# Patient Record
Sex: Female | Born: 1950 | Race: White | Hispanic: No | Marital: Single | Smoking: Never smoker
Health system: Southern US, Community
[De-identification: ages and names within clinical notes are randomized; demographics above are authoritative.]

## PROBLEM LIST (undated history)

## (undated) DIAGNOSIS — J189 Pneumonia, unspecified organism: Secondary | ICD-10-CM

## (undated) DIAGNOSIS — M1990 Unspecified osteoarthritis, unspecified site: Secondary | ICD-10-CM

## (undated) DIAGNOSIS — E039 Hypothyroidism, unspecified: Secondary | ICD-10-CM

## (undated) HISTORY — PX: EYE SURGERY: SHX253

## (undated) HISTORY — PX: TONSILLECTOMY: SUR1361

---

## 2018-06-03 ENCOUNTER — Ambulatory Visit (INDEPENDENT_AMBULATORY_CARE_PROVIDER_SITE_OTHER): Payer: Medicare Other | Admitting: Family Medicine

## 2018-06-03 ENCOUNTER — Ambulatory Visit (INDEPENDENT_AMBULATORY_CARE_PROVIDER_SITE_OTHER): Payer: Medicare Other

## 2018-06-03 ENCOUNTER — Encounter (INDEPENDENT_AMBULATORY_CARE_PROVIDER_SITE_OTHER): Payer: Self-pay | Admitting: Family Medicine

## 2018-06-03 DIAGNOSIS — M25552 Pain in left hip: Secondary | ICD-10-CM | POA: Diagnosis not present

## 2018-06-03 DIAGNOSIS — M25551 Pain in right hip: Secondary | ICD-10-CM

## 2018-06-03 NOTE — Progress Notes (Signed)
Office Visit Note   Patient: Suzanne CollegeDeborah Gibson           Date of Birth: 06/17/1951           MRN: [id number] Visit Date: 06/03/2018 Requested by: No referring provider defined for this encounter. PCP: No primary care provider on file.  Subjective: Chief Complaint  Patient presents with  . Left Hip - Pain    Pain x 2 years.  Tried PT - temporary relief.  Worsening pain.    HPI: She is a 67 year old with left greater than right hip pain.  She is seen at the request of Suzanne Gibson.  Symptoms started in 2014.  Around that time [place] and was bit by a wild dog.  The bite was in her lower left leg.  She was treated with rabies vaccine and antibiotics and eventually the wound healed but a few months later she started having a lot of problems with her left hip.  Her right hip was hurting as well.  She has continued to have troubles since then with groin pain radiating down the medial thigh and across to the lateral knee as well as in the posterior hip.  She has similar pain on the right side but not as severe.  She has been managing with holistic treatments and massage and physical therapy.  She had an MRI scan 2 years ago which she brought with her on CD showing moderate arthritis and subchondral cystic change in the femoral head.  The radiology report is not available.  On a bad day her pain is severe and she can hardly get out of the chair.  Most days she has stiffness and discomfort and a sensation of weakness.  She is living in [place] but is not confident in the treatment she has available and frequently comes here to visit her niece who is with her today.  She wanted to get our opinion.              ROS: She has thyroid dysfunction.  All other systems are negative.  Objective: Vital Signs: There were no vitals taken for this visit.  Physical Exam:  Hips: Slight leg length discrepancy with right leg a little bit longer than the left.  Both hips have limited range of  motion with passive flexion and internal rotation, and this causes a moderate amount of pain.  External rotation motion is better bilaterally.  No significant pain over the greater trochanter.  Mild pain with isometric hip flexion, adduction, abduction.  She is able to walk without a limp.    Imaging: X-rays pelvis and bilateral hips: L4-5DD; moderate-severe left hip DJD with joint space narrowing and periarticular spurring, as well as subchondral cystic change.  Mild-moderate right hip DJD.   Assessment & Plan: 1.  Left greater than right hip pain, most likely due to DJD. - MRI per patient request. - She has a consult this Wednesday to discuss surgical options. - If surgery delayed, could consider a diagnostic/therapeutic injection.   Follow-Up Instructions: No follow-ups on file.      Procedures: No procedures performed  No notes on file    PMFS History: There are no active problems to display for this patient.  History reviewed. No pertinent past medical history.  History reviewed. No pertinent family history.  History reviewed. No pertinent surgical history. Social History   Occupational History  . Not on file  Tobacco Use  . Smoking status: Never Smoker  .  Smokeless tobacco: Never Used  Substance and Sexual Activity  . Alcohol use: Yes    Alcohol/week: 2.0 standard drinks    Types: 2 Glasses of wine per week  . Drug use: Never  . Sexual activity: Not on file

## 2018-06-11 ENCOUNTER — Other Ambulatory Visit: Payer: Self-pay

## 2018-08-19 NOTE — Progress Notes (Signed)
07-29-18 Surgical clearance from Dr. Janine Ores on chart

## 2018-08-19 NOTE — Patient Instructions (Signed)
Suzanne Gibson  08/19/2018   Your procedure is scheduled on: 08-27-18   Report to [facility] Main  Entrance    Report to Admitting at 6:10 AM    Call this number if you have problems the morning of surgery [phone]    Remember: Do not eat food or drink liquids :After Midnight.    BRUSH YOUR TEETH MORNING OF SURGERY AND RINSE YOUR MOUTH OUT, NO CHEWING GUM CANDY OR MINTS.     Take these medicines the morning of surgery with A SIP OF WATER: None                                 You may not have any metal on your body including hair pins and              piercings  Do not wear jewelry, make-up, lotions, powders or perfumes, deodorant             Do not wear nail polish.  Do not shave  48 hours prior to surgery.               Do not bring valuables to the hospital.  IS NOT             RESPONSIBLE   FOR VALUABLES.  Contacts, dentures or bridgework may not be worn into surgery.  Leave suitcase in the car. After surgery it may be brought to your room.     Patients discharged the day of surgery will not be allowed to drive home. IF YOU ARE HAVING SURGERY AND GOING HOME THE SAME DAY, YOU MUST HAVE AN ADULT TO DRIVE YOU HOME AND BE WITH YOU FOR 24 HOURS. YOU MAY GO HOME BY TAXI OR UBER OR ORTHERWISE, BUT AN ADULT MUST ACCOMPANY YOU HOME AND STAY WITH YOU FOR 24 HOURS.  Name and phone number of your driver:  Special Instructions: N/A              Please read over the following fact sheets you were given: _____________________________________________________________________             [facility] - Preparing for Surgery Before surgery, you can play an important role.  Because skin is not sterile, your skin needs to be as free of germs as possible.  You can reduce the number of germs on your skin by washing with CHG (chlorahexidine gluconate) soap before surgery.  CHG is an antiseptic cleaner which kills germs and bonds with the skin to continue  killing germs even after washing. Please DO NOT use if you have an allergy to CHG or antibacterial soaps.  If your skin becomes reddened/irritated stop using the CHG and inform your nurse when you arrive at Short Stay. Do not shave (including legs and underarms) for at least 48 hours prior to the first CHG shower.  You may shave your face/neck. Please follow these instructions carefully:  1.  Shower with CHG Soap the night before surgery and the  morning of Surgery.  2.  If you choose to wash your hair, wash your hair first as usual with your  normal  shampoo.  3.  After you shampoo, rinse your hair and body thoroughly to remove the  shampoo.  4.  Use CHG as you would any other liquid soap.  You can apply chg directly  to the skin and wash                       Gently with a scrungie or clean washcloth.  5.  Apply the CHG Soap to your body ONLY FROM THE NECK DOWN.   Do not use on face/ open                           Wound or open sores. Avoid contact with eyes, ears mouth and genitals (private parts).                       Wash face,  Genitals (private parts) with your normal soap.             6.  Wash thoroughly, paying special attention to the area where your surgery  will be performed.  7.  Thoroughly rinse your body with warm water from the neck down.  8.  DO NOT shower/wash with your normal soap after using and rinsing off  the CHG Soap.                9.  Pat yourself dry with a clean towel.            10.  Wear clean pajamas.            11.  Place clean sheets on your bed the night of your first shower and do not  sleep with pets. Day of Surgery : Do not apply any lotions/deodorants the morning of surgery.  Please wear clean clothes to the hospital/surgery center.  FAILURE TO FOLLOW THESE INSTRUCTIONS MAY RESULT IN THE CANCELLATION OF YOUR SURGERY PATIENT SIGNATURE_________________________________  NURSE  SIGNATURE__________________________________  ________________________________________________________________________   Suzanne Gibson  An incentive spirometer is a tool that can help keep your lungs clear and active. This tool measures how well you are filling your lungs with each breath. Taking long deep breaths may help reverse or decrease the chance of developing breathing (pulmonary) problems (especially infection) following:  A long period of time when you are unable to move or be active. BEFORE THE PROCEDURE   If the spirometer includes an indicator to show your best effort, your nurse or respiratory therapist will set it to a desired goal.  If possible, sit up straight or lean slightly forward. Try not to slouch.  Hold the incentive spirometer in an upright position. INSTRUCTIONS FOR USE  1. Sit on the edge of your bed if possible, or sit up as far as you can in bed or on a chair. 2. Hold the incentive spirometer in an upright position. 3. Breathe out normally. 4. Place the mouthpiece in your mouth and seal your lips tightly around it. 5. Breathe in slowly and as deeply as possible, raising the piston or the ball toward the top of the column. 6. Hold your breath for 3-5 seconds or for as long as possible. Allow the piston or ball to fall to the bottom of the column. 7. Remove the mouthpiece from your mouth and breathe out normally. 8. Rest for a few seconds and repeat Steps 1 through 7 at least 10 times every 1-2 hours when you are awake. Take your time and take a few normal breaths between deep breaths. 9. The spirometer may include an indicator to show  your best effort. Use the indicator as a goal to work toward during each repetition. 10. After each set of 10 deep breaths, practice coughing to be sure your lungs are clear. If you have an incision (the cut made at the time of surgery), support your incision when coughing by placing a pillow or rolled up towels firmly  against it. Once you are able to get out of bed, walk around indoors and cough well. You may stop using the incentive spirometer when instructed by your caregiver.  RISKS AND COMPLICATIONS  Take your time so you do not get dizzy or light-headed.  If you are in pain, you may need to take or ask for pain medication before doing incentive spirometry. It is harder to take a deep breath if you are having pain. AFTER USE  Rest and breathe slowly and easily.  It can be helpful to keep track of a log of your progress. Your caregiver can provide you with a simple table to help with this. If you are using the spirometer at home, follow these instructions: [place] IF:   You are having difficultly using the spirometer.  You have trouble using the spirometer as often as instructed.  Your pain medication is not giving enough relief while using the spirometer.  You develop fever of 100.5 F (38.1 C) or higher. SEEK IMMEDIATE MEDICAL CARE IF:   You cough up bloody sputum that had not been present before.  You develop fever of 102 F (38.9 C) or greater.  You develop worsening pain at or near the incision site. MAKE SURE YOU:   Understand these instructions.  Will watch your condition.  Will get help right away if you are not doing well or get worse. Document Released: 11/13/2006 Document Revised: 09/25/2011 Document Reviewed: 01/14/2007 ExitCare Patient Information 2014 ExitCare, [place].   ________________________________________________________________________  WHAT IS A BLOOD TRANSFUSION? Blood Transfusion Information  A transfusion is the replacement of blood or some of its parts. Blood is made up of multiple cells which provide different functions.  Red blood cells carry oxygen and are used for blood loss replacement.  White blood cells fight against infection.  Platelets control bleeding.  Plasma helps clot blood.  Other blood products are available for  specialized needs, such as hemophilia or other clotting disorders. BEFORE THE TRANSFUSION  Who gives blood for transfusions?   Healthy volunteers who are fully evaluated to make sure their blood is safe. This is blood bank blood. Transfusion therapy is the safest it has ever been in the practice of medicine. Before blood is taken from a donor, a complete history is taken to make sure that person has no history of diseases nor engages in risky social behavior (examples are intravenous drug use or sexual activity with multiple partners). The donor's travel history is screened to minimize risk of transmitting infections, such as malaria. The donated blood is tested for signs of infectious diseases, such as HIV and hepatitis. The blood is then tested to be sure it is compatible with you in order to minimize the chance of a transfusion reaction. If you or a relative donates blood, this is often done in anticipation of surgery and is not appropriate for emergency situations. It takes many days to process the donated blood. RISKS AND COMPLICATIONS Although transfusion therapy is very safe and saves many lives, the main dangers of transfusion include:   Getting an infectious disease.  Developing a transfusion reaction. This is an allergic reaction to  something in the blood you were given. Every precaution is taken to prevent this. The decision to have a blood transfusion has been considered carefully by your caregiver before blood is given. Blood is not given unless the benefits outweigh the risks. AFTER THE TRANSFUSION  Right after receiving a blood transfusion, you will usually feel much better and more energetic. This is especially true if your red blood cells have gotten low (anemic). The transfusion raises the level of the red blood cells which carry oxygen, and this usually causes an energy increase.  The nurse administering the transfusion will monitor you carefully for complications. HOME CARE  INSTRUCTIONS  No special instructions are needed after a transfusion. You may find your energy is better. Speak with your caregiver about any limitations on activity for underlying diseases you may have. SEEK MEDICAL CARE IF:   Your condition is not improving after your transfusion.  You develop redness or irritation at the intravenous (IV) site. SEEK IMMEDIATE MEDICAL CARE IF:  Any of the following symptoms occur over the next 12 hours:  Shaking chills.  You have a temperature by mouth above 102 F (38.9 C), not controlled by medicine.  Chest, back, or muscle pain.  People around you feel you are not acting correctly or are confused.  Shortness of breath or difficulty breathing.  Dizziness and fainting.  You get a rash or develop hives.  You have a decrease in urine output.  Your urine turns a dark color or changes to pink, red, or brown. Any of the following symptoms occur over the next 10 days:  You have a temperature by mouth above 102 F (38.9 C), not controlled by medicine.  Shortness of breath.  Weakness after normal activity.  The white part of the eye turns yellow (jaundice).  You have a decrease in the amount of urine or are urinating less often.  Your urine turns a dark color or changes to pink, red, or brown. Document Released: 06/30/2000 Document Revised: 09/25/2011 Document Reviewed: 02/17/2008 [facility] Patient Information 2014 [place], [place].  _______________________________________________________________________

## 2018-08-21 ENCOUNTER — Other Ambulatory Visit: Payer: Self-pay

## 2018-08-21 ENCOUNTER — Encounter (HOSPITAL_COMMUNITY): Payer: Self-pay | Admitting: *Deleted

## 2018-08-21 ENCOUNTER — Encounter (HOSPITAL_COMMUNITY)
Admission: RE | Admit: 2018-08-21 | Discharge: 2018-08-21 | Disposition: A | Discharge status: Home / Self Care | Payer: Medicare Other | Source: Ambulatory Visit | Attending: Orthopedic Surgery | Admitting: Orthopedic Surgery

## 2018-08-21 DIAGNOSIS — M1612 Unilateral primary osteoarthritis, left hip: Secondary | ICD-10-CM | POA: Insufficient documentation

## 2018-08-21 DIAGNOSIS — Z01812 Encounter for preprocedural laboratory examination: Secondary | ICD-10-CM | POA: Diagnosis not present

## 2018-08-21 HISTORY — DX: Hypothyroidism, unspecified: E03.9

## 2018-08-21 HISTORY — DX: Pneumonia, unspecified organism: J18.9

## 2018-08-21 LAB — BASIC METABOLIC PANEL
Anion gap: 6 (ref 5–15)
BUN: 15 mg/dL (ref 8–23)
CO2: 29 mmol/L (ref 22–32)
Calcium: 8.8 mg/dL — ABNORMAL LOW (ref 8.9–10.3)
Chloride: 103 mmol/L (ref 98–111)
Creatinine, Ser: 1.05 mg/dL — ABNORMAL HIGH (ref 0.44–1.00)
GFR calc Af Amer: >60 mL/min (ref >60)
GFR calc non Af Amer: 54 mL/min — ABNORMAL LOW (ref >60)
Glucose, Bld: 102 mg/dL — ABNORMAL HIGH (ref 70–99)
Potassium: 3.9 mmol/L (ref 3.5–5.1)
Sodium: 138 mmol/L (ref 135–145)

## 2018-08-21 LAB — CBC
HEMATOCRIT: 40.3 % (ref 36.0–46.0)
Hemoglobin: 13.4 g/dL (ref 12.0–15.0)
MCH: 32.4 pg (ref 26.0–34.0)
MCHC: 33.3 g/dL (ref 30.0–36.0)
MCV: 97.3 fL (ref 80.0–100.0)
Platelets: 274 10*3/uL (ref 150–400)
RBC: 4.14 MIL/uL (ref 3.87–5.11)
RDW: 13.4 % (ref 11.5–15.5)
WBC: 6.9 10*3/uL (ref 4.0–10.5)
nRBC: 0 % (ref 0.0–0.2)

## 2018-08-21 LAB — SURGICAL PCR SCREEN
MRSA, PCR: NEGATIVE
STAPHYLOCOCCUS AUREUS: NEGATIVE

## 2018-08-22 LAB — ABO/RH: ABO/RH(D): O POS

## 2018-08-22 NOTE — H&P (Signed)
TOTAL HIP ADMISSION H&P  Patient is admitted for left total hip arthroplasty, anterior approach.  Subjective:  Chief Complaint:    Left hip primary OA / pain  HPI: Suzanne Gibson, 68 y.o. female, has a history of pain and functional disability in the left hip(s) due to arthritis and patient has failed non-surgical conservative treatments for greater than 12 weeks to include NSAID's and/or analgesics, supervised PT with diminished ADL's post treatment and activity modification.  Onset of symptoms was gradual starting 3-4 years ago with gradually worsening course since that time.The patient noted no past surgery on the left hip(s).  Patient currently rates pain in the left hip at 9 out of 10 with activity. Patient has worsening of pain with activity and weight bearing, trendelenberg gait, pain that interfers with activities of daily living and pain with passive range of motion. Patient has evidence of periarticular osteophytes and joint space narrowing by imaging studies. This condition presents safety issues increasing the risk of falls.  There is no current active infection.   Risks, benefits and expectations were discussed with the patient.  Risks including but not limited to the risk of anesthesia, blood clots, nerve damage, blood vessel damage, failure of the prosthesis, infection and up to and including death.  Patient understand the risks, benefits and expectations and wishes to proceed with surgery.   PCP: System, Pcp Not In  D/C Plans:       Home   Post-op Meds:       No Rx given  Tranexamic Acid:      To be given - IV   Decadron:      Is to be given  FYI:      ASA  APAP scheduled  Tramadol prn  DME:   Rx given for - RW & 3-n-1  PT:   No PT     Past Medical History:  Diagnosis Date  . Hypothyroidism   . Pneumonia     Past Surgical History:  Procedure Laterality Date  . TONSILLECTOMY      No current facility-administered medications for this encounter.    Current  Outpatient Medications  Medication Sig Dispense Refill Last Dose  . b complex vitamins capsule Take 1 capsule by mouth daily.     . Multiple Vitamin (MULTIVITAMIN) tablet Take 1 tablet by mouth daily.     . NONFORMULARY OR COMPOUNDED ITEM Apply 1 application topically See admin instructions. Apply topically 3 times daily six days a week.  Estrogen, Testosterone and Progesterone cream     . Thyroid 48.75 MG TABS Take 48.75 mg by mouth daily. 3/4 grain of armour thyroid      Allergies  Allergen Reactions  . Augmentin [Amoxicillin-Pot Clavulanate] Rash  . Levofloxacin Rash    Headache    Social History   Tobacco Use  . Smoking status: Never Smoker  . Smokeless tobacco: Never Used  Substance Use Topics  . Alcohol use: Yes    Alcohol/week: 2.0 standard drinks    Types: 2 Glasses of wine per week       Review of Systems  Constitutional: Negative.   HENT: Negative.   Eyes: Negative.   Respiratory: Negative.   Cardiovascular: Negative.   Gastrointestinal: Negative.   Genitourinary: Negative.   Musculoskeletal: Positive for joint pain.  Skin: Negative.   Neurological: Negative.   Endo/Heme/Allergies: Negative.   Psychiatric/Behavioral: Negative.     Objective:  Physical Exam  Constitutional: She is oriented to person, place, and time. She  appears well-developed.  HENT:  Head: Normocephalic.  Eyes: Pupils are equal, round, and reactive to light.  Neck: Neck supple. No JVD present. No tracheal deviation present. No thyromegaly present.  Cardiovascular: Normal rate, regular rhythm and intact distal pulses.  Respiratory: Effort normal and breath sounds normal. No respiratory distress. She has no wheezes.  GI: Soft. There is no abdominal tenderness. There is no guarding.  Musculoskeletal:     Left hip: She exhibits decreased range of motion, decreased strength, tenderness and bony tenderness. She exhibits no swelling, no deformity and no laceration.  Lymphadenopathy:    She  has no cervical adenopathy.  Neurological: She is alert and oriented to person, place, and time.  Skin: Skin is warm and dry.  Psychiatric: She has a normal mood and affect.    Vital signs in last 24 hours: Temp:  [98.5 F (36.9 C)] 98.5 F (36.9 C) (02/05 1412) Pulse Rate:  [80] 80 (02/05 1412) Resp:  [18] 18 (02/05 1412) BP: (121)/(72) 121/72 (02/05 1412) SpO2:  [98 %] 98 % (02/05 1412) Weight:  [58.3 kg] 58.3 kg (02/05 1412)  Labs:   Estimated body mass index is 20.14 kg/m as calculated from the following:   Height as of 08/21/18: 5\' 7"  (1.702 m).   Weight as of 08/21/18: 58.3 kg.   Imaging Review Plain radiographs demonstrate severe degenerative joint disease of the left hip(s). The bone quality appears to be good for age and reported activity level.      Assessment/Plan:  End stage arthritis, left hip  The patient history, physical examination, clinical judgement of the provider and imaging studies are consistent with end stage degenerative joint disease of the left hip(s) and total hip arthroplasty is deemed medically necessary. The treatment options including medical management, injection therapy, arthroscopy and arthroplasty were discussed at length. The risks and benefits of total hip arthroplasty were presented and reviewed. The risks due to aseptic loosening, infection, stiffness, dislocation/subluxation,  thromboembolic complications and other imponderables were discussed.  The patient acknowledged the explanation, agreed to proceed with the plan and consent was signed. Patient is being admitted for inpatient treatment for surgery, pain control, PT, OT, prophylactic antibiotics, VTE prophylaxis, progressive ambulation and ADL's and discharge planning.The patient is planning to be discharged home.     Anastasio Auerbach Mckennon Zwart   PA-C  08/22/2018, 2:03 PM

## 2018-08-27 ENCOUNTER — Observation Stay (HOSPITAL_COMMUNITY): Payer: Medicare Other

## 2018-08-27 ENCOUNTER — Observation Stay (HOSPITAL_COMMUNITY)
Admission: RE | Admit: 2018-08-27 | Discharge: 2018-08-28 | Disposition: A | Discharge status: Home / Self Care | Payer: Medicare Other | Attending: Orthopedic Surgery | Admitting: Orthopedic Surgery

## 2018-08-27 ENCOUNTER — Inpatient Hospital Stay (HOSPITAL_COMMUNITY): Payer: Medicare Other

## 2018-08-27 ENCOUNTER — Encounter (HOSPITAL_COMMUNITY): Payer: Self-pay | Admitting: *Deleted

## 2018-08-27 ENCOUNTER — Encounter (HOSPITAL_COMMUNITY)
Admission: RE | Disposition: A | Discharge status: Home / Self Care | Payer: Self-pay | Source: Home / Self Care | Attending: Orthopedic Surgery

## 2018-08-27 ENCOUNTER — Inpatient Hospital Stay (HOSPITAL_COMMUNITY): Payer: Medicare Other | Admitting: Physician Assistant

## 2018-08-27 ENCOUNTER — Other Ambulatory Visit: Payer: Self-pay

## 2018-08-27 ENCOUNTER — Inpatient Hospital Stay (HOSPITAL_COMMUNITY): Payer: Medicare Other | Admitting: Anesthesiology

## 2018-08-27 DIAGNOSIS — Z79899 Other long term (current) drug therapy: Secondary | ICD-10-CM | POA: Insufficient documentation

## 2018-08-27 DIAGNOSIS — E039 Hypothyroidism, unspecified: Secondary | ICD-10-CM | POA: Insufficient documentation

## 2018-08-27 DIAGNOSIS — Z79890 Hormone replacement therapy (postmenopausal): Secondary | ICD-10-CM | POA: Diagnosis not present

## 2018-08-27 DIAGNOSIS — M1612 Unilateral primary osteoarthritis, left hip: Principal | ICD-10-CM | POA: Insufficient documentation

## 2018-08-27 DIAGNOSIS — M25852 Other specified joint disorders, left hip: Secondary | ICD-10-CM | POA: Insufficient documentation

## 2018-08-27 DIAGNOSIS — Z96642 Presence of left artificial hip joint: Secondary | ICD-10-CM

## 2018-08-27 DIAGNOSIS — Z419 Encounter for procedure for purposes other than remedying health state, unspecified: Secondary | ICD-10-CM

## 2018-08-27 DIAGNOSIS — M25752 Osteophyte, left hip: Secondary | ICD-10-CM | POA: Diagnosis not present

## 2018-08-27 DIAGNOSIS — Z96649 Presence of unspecified artificial hip joint: Secondary | ICD-10-CM

## 2018-08-27 HISTORY — PX: TOTAL HIP ARTHROPLASTY: SHX124

## 2018-08-27 LAB — TYPE AND SCREEN
ABO/RH(D): O POS
Antibody Screen: NEGATIVE

## 2018-08-27 SURGERY — ARTHROPLASTY, HIP, TOTAL, ANTERIOR APPROACH
Anesthesia: Monitor Anesthesia Care | Site: Hip | Laterality: Left

## 2018-08-27 MED ORDER — SODIUM CHLORIDE 0.9 % IV SOLN
INTRAVENOUS | Status: DC
  Administered 2018-08-27 (×2): via INTRAVENOUS

## 2018-08-27 MED ORDER — BUPIVACAINE IN DEXTROSE 0.75-8.25 % IT SOLN
INTRATHECAL | Status: DC | PRN
  Administered 2018-08-27: 1.8 mL via INTRATHECAL

## 2018-08-27 MED ORDER — PROPOFOL 500 MG/50ML IV EMUL
INTRAVENOUS | Status: DC | PRN
  Administered 2018-08-27: 75 ug/kg/min via INTRAVENOUS

## 2018-08-27 MED ORDER — FENTANYL CITRATE (PF) 100 MCG/2ML IJ SOLN
INTRAMUSCULAR | Status: AC
  Filled 2018-08-27: qty 2

## 2018-08-27 MED ORDER — ASPIRIN 81 MG PO CHEW: 81.0000 mg | CHEWABLE_TABLET | Freq: Two times a day (BID) | ORAL | 0 refills | Status: AC

## 2018-08-27 MED ORDER — ONDANSETRON HCL 4 MG/2ML IJ SOLN: 4.0000 mg | Freq: Four times a day (QID) | INTRAMUSCULAR | Status: DC | PRN

## 2018-08-27 MED ORDER — LIP MEDEX EX OINT
TOPICAL_OINTMENT | CUTANEOUS | Status: AC
  Filled 2018-08-27: qty 7

## 2018-08-27 MED ORDER — PROPOFOL 10 MG/ML IV BOLUS: INTRAVENOUS | Status: DC | PRN

## 2018-08-27 MED ORDER — OXYCODONE HCL 5 MG PO TABS: 5.0000 mg | ORAL_TABLET | Freq: Once | ORAL | Status: DC | PRN

## 2018-08-27 MED ORDER — ACETAMINOPHEN 500 MG PO TABS
ORAL_TABLET | ORAL | Status: AC
  Filled 2018-08-27: qty 2

## 2018-08-27 MED ORDER — PROPOFOL 10 MG/ML IV BOLUS
INTRAVENOUS | Status: AC
  Filled 2018-08-27: qty 40

## 2018-08-27 MED ORDER — DOCUSATE SODIUM 100 MG PO CAPS: 100.0000 mg | ORAL_CAPSULE | Freq: Two times a day (BID) | ORAL | 0 refills | Status: AC

## 2018-08-27 MED ORDER — ACETAMINOPHEN 500 MG PO TABS
1000.0000 mg | ORAL_TABLET | Freq: Four times a day (QID) | ORAL | Status: AC
  Administered 2018-08-27 – 2018-08-28 (×4): 1000 mg via ORAL
  Filled 2018-08-27 (×3): qty 2

## 2018-08-27 MED ORDER — METHOCARBAMOL 500 MG IVPB - SIMPLE MED
500.0000 mg | Freq: Four times a day (QID) | INTRAVENOUS | Status: DC | PRN
  Administered 2018-08-27: 500 mg via INTRAVENOUS
  Filled 2018-08-27: qty 50

## 2018-08-27 MED ORDER — DOCUSATE SODIUM 100 MG PO CAPS: 100.0000 mg | ORAL_CAPSULE | Freq: Two times a day (BID) | ORAL | Status: DC

## 2018-08-27 MED ORDER — ONDANSETRON HCL 4 MG PO TABS: 4.0000 mg | ORAL_TABLET | Freq: Four times a day (QID) | ORAL | Status: DC | PRN

## 2018-08-27 MED ORDER — MORPHINE SULFATE (PF) 2 MG/ML IV SOLN: 0.5000 mg | INTRAVENOUS | Status: DC | PRN

## 2018-08-27 MED ORDER — OXYCODONE HCL 5 MG/5ML PO SOLN
5.0000 mg | Freq: Once | ORAL | Status: DC | PRN
  Filled 2018-08-27: qty 5

## 2018-08-27 MED ORDER — MENTHOL 3 MG MT LOZG: 1.0000 | LOZENGE | OROMUCOSAL | Status: DC | PRN

## 2018-08-27 MED ORDER — CELECOXIB 200 MG PO CAPS
200.0000 mg | ORAL_CAPSULE | Freq: Two times a day (BID) | ORAL | Status: DC
  Administered 2018-08-27 – 2018-08-28 (×3): 200 mg via ORAL
  Filled 2018-08-27 (×2): qty 1

## 2018-08-27 MED ORDER — PROPOFOL 10 MG/ML IV BOLUS
INTRAVENOUS | Status: AC
  Filled 2018-08-27: qty 20

## 2018-08-27 MED ORDER — CHLORHEXIDINE GLUCONATE 4 % EX LIQD: 60.0000 mL | Freq: Once | CUTANEOUS | Status: DC

## 2018-08-27 MED ORDER — FERROUS SULFATE 325 (65 FE) MG PO TABS: 325.0000 mg | ORAL_TABLET | Freq: Three times a day (TID) | ORAL | 3 refills | Status: AC

## 2018-08-27 MED ORDER — PHENOL 1.4 % MT LIQD: 1.0000 | OROMUCOSAL | Status: DC | PRN

## 2018-08-27 MED ORDER — METHOCARBAMOL 500 MG PO TABS: 500.0000 mg | ORAL_TABLET | Freq: Four times a day (QID) | ORAL | Status: DC | PRN

## 2018-08-27 MED ORDER — MAGNESIUM CITRATE PO SOLN: 1.0000 | Freq: Once | ORAL | Status: DC | PRN

## 2018-08-27 MED ORDER — TRAMADOL HCL 50 MG PO TABS
ORAL_TABLET | ORAL | Status: AC
  Filled 2018-08-27: qty 1

## 2018-08-27 MED ORDER — CEFAZOLIN SODIUM-DEXTROSE 2-4 GM/100ML-% IV SOLN
2.0000 g | INTRAVENOUS | Status: AC
  Administered 2018-08-27: 2 g via INTRAVENOUS
  Filled 2018-08-27: qty 100

## 2018-08-27 MED ORDER — TRANEXAMIC ACID-NACL 1000-0.7 MG/100ML-% IV SOLN
1000.0000 mg | INTRAVENOUS | Status: AC
  Administered 2018-08-27: 1000 mg via INTRAVENOUS
  Filled 2018-08-27: qty 100

## 2018-08-27 MED ORDER — DEXAMETHASONE SODIUM PHOSPHATE 10 MG/ML IJ SOLN
10.0000 mg | Freq: Once | INTRAMUSCULAR | Status: AC
  Administered 2018-08-27: 8 mg via INTRAVENOUS

## 2018-08-27 MED ORDER — DIPHENHYDRAMINE HCL 12.5 MG/5ML PO ELIX: 12.5000 mg | ORAL_SOLUTION | ORAL | Status: DC | PRN

## 2018-08-27 MED ORDER — TRAMADOL HCL 50 MG PO TABS: 100.0000 mg | ORAL_TABLET | Freq: Four times a day (QID) | ORAL | Status: DC | PRN

## 2018-08-27 MED ORDER — METOCLOPRAMIDE HCL 5 MG PO TABS: 5.0000 mg | ORAL_TABLET | Freq: Three times a day (TID) | ORAL | Status: DC | PRN

## 2018-08-27 MED ORDER — DEXAMETHASONE SODIUM PHOSPHATE 10 MG/ML IJ SOLN
INTRAMUSCULAR | Status: AC
  Filled 2018-08-27: qty 1

## 2018-08-27 MED ORDER — CELECOXIB 200 MG PO CAPS
ORAL_CAPSULE | ORAL | Status: AC
  Filled 2018-08-27: qty 1

## 2018-08-27 MED ORDER — ONDANSETRON HCL 4 MG/2ML IJ SOLN
INTRAMUSCULAR | Status: DC | PRN
  Administered 2018-08-27: 4 mg via INTRAVENOUS

## 2018-08-27 MED ORDER — THYROID 30 MG PO TABS
45.0000 mg | ORAL_TABLET | Freq: Every day | ORAL | Status: DC
  Administered 2018-08-28: 45 mg via ORAL
  Filled 2018-08-27: qty 2

## 2018-08-27 MED ORDER — ALUM & MAG HYDROXIDE-SIMETH 200-200-20 MG/5ML PO SUSP: 15.0000 mL | ORAL | Status: DC | PRN

## 2018-08-27 MED ORDER — FERROUS SULFATE 325 (65 FE) MG PO TABS
325.0000 mg | ORAL_TABLET | Freq: Three times a day (TID) | ORAL | Status: DC
  Administered 2018-08-28: 325 mg via ORAL
  Filled 2018-08-27: qty 1

## 2018-08-27 MED ORDER — ALBUMIN HUMAN 5 % IV SOLN
INTRAVENOUS | Status: DC | PRN
  Administered 2018-08-27: 10:00:00 via INTRAVENOUS

## 2018-08-27 MED ORDER — ACETAMINOPHEN 500 MG PO TABS: 1000.0000 mg | ORAL_TABLET | Freq: Three times a day (TID) | ORAL | 0 refills | Status: AC

## 2018-08-27 MED ORDER — SODIUM CHLORIDE 0.9 % IR SOLN
Status: DC | PRN
  Administered 2018-08-27: 1

## 2018-08-27 MED ORDER — METHOCARBAMOL 500 MG IVPB - SIMPLE MED
INTRAVENOUS | Status: AC
  Filled 2018-08-27: qty 50

## 2018-08-27 MED ORDER — TRAMADOL HCL 50 MG PO TABS
50.0000 mg | ORAL_TABLET | Freq: Four times a day (QID) | ORAL | Status: DC | PRN
  Filled 2018-08-27: qty 1

## 2018-08-27 MED ORDER — METHOCARBAMOL 500 MG PO TABS: 500.0000 mg | ORAL_TABLET | Freq: Four times a day (QID) | ORAL | 0 refills | Status: DC | PRN

## 2018-08-27 MED ORDER — FENTANYL CITRATE (PF) 100 MCG/2ML IJ SOLN
25.0000 ug | INTRAMUSCULAR | Status: DC | PRN
  Administered 2018-08-27: 50 ug via INTRAVENOUS
  Administered 2018-08-27 (×2): 25 ug via INTRAVENOUS

## 2018-08-27 MED ORDER — METOCLOPRAMIDE HCL 5 MG/ML IJ SOLN: 5.0000 mg | Freq: Three times a day (TID) | INTRAMUSCULAR | Status: DC | PRN

## 2018-08-27 MED ORDER — TRAMADOL HCL 50 MG PO TABS: 50.0000 mg | ORAL_TABLET | Freq: Four times a day (QID) | ORAL | 0 refills | Status: AC | PRN

## 2018-08-27 MED ORDER — BISACODYL 10 MG RE SUPP: 10.0000 mg | Freq: Every day | RECTAL | Status: DC | PRN

## 2018-08-27 MED ORDER — CEFAZOLIN SODIUM-DEXTROSE 2-4 GM/100ML-% IV SOLN
2.0000 g | Freq: Four times a day (QID) | INTRAVENOUS | Status: AC
  Administered 2018-08-27 (×2): 2 g via INTRAVENOUS
  Filled 2018-08-27 (×2): qty 100

## 2018-08-27 MED ORDER — TRANEXAMIC ACID-NACL 1000-0.7 MG/100ML-% IV SOLN
1000.0000 mg | Freq: Once | INTRAVENOUS | Status: AC
  Administered 2018-08-27: 1000 mg via INTRAVENOUS
  Filled 2018-08-27 (×2): qty 100

## 2018-08-27 MED ORDER — THYROID 48.75 MG PO TABS: 48.7500 mg | ORAL_TABLET | Freq: Every day | ORAL | Status: DC

## 2018-08-27 MED ORDER — POLYETHYLENE GLYCOL 3350 17 G PO PACK: 17.0000 g | PACK | Freq: Two times a day (BID) | ORAL | 0 refills | Status: AC

## 2018-08-27 MED ORDER — ONDANSETRON HCL 4 MG/2ML IJ SOLN
INTRAMUSCULAR | Status: AC
  Filled 2018-08-27: qty 2

## 2018-08-27 MED ORDER — DEXAMETHASONE SODIUM PHOSPHATE 10 MG/ML IJ SOLN
10.0000 mg | Freq: Once | INTRAMUSCULAR | Status: AC
  Administered 2018-08-28: 10 mg via INTRAVENOUS
  Filled 2018-08-27: qty 1

## 2018-08-27 MED ORDER — LACTATED RINGERS IV SOLN
INTRAVENOUS | Status: DC
  Administered 2018-08-27 (×2): via INTRAVENOUS

## 2018-08-27 MED ORDER — ASPIRIN 81 MG PO CHEW
81.0000 mg | CHEWABLE_TABLET | Freq: Two times a day (BID) | ORAL | Status: DC
  Administered 2018-08-27 – 2018-08-28 (×2): 81 mg via ORAL
  Filled 2018-08-27 (×2): qty 1

## 2018-08-27 MED ORDER — POLYETHYLENE GLYCOL 3350 17 G PO PACK: 17.0000 g | PACK | Freq: Two times a day (BID) | ORAL | Status: DC

## 2018-08-27 MED ORDER — ALBUMIN HUMAN 5 % IV SOLN
INTRAVENOUS | Status: AC
  Filled 2018-08-27: qty 250

## 2018-08-27 SURGICAL SUPPLY — 41 items
BAG DECANTER FOR FLEXI CONT (MISCELLANEOUS) IMPLANT
BAG ZIPLOCK 12X15 (MISCELLANEOUS) IMPLANT
BLADE SAG 18X100X1.27 (BLADE) ×2 IMPLANT
BLADE SURG SZ10 CARB STEEL (BLADE) ×4 IMPLANT
COVER PERINEAL POST (MISCELLANEOUS) ×2 IMPLANT
COVER SURGICAL LIGHT HANDLE (MISCELLANEOUS) ×2 IMPLANT
COVER WAND RF STERILE (DRAPES) IMPLANT
CUP ACETBLR 52 OD PINNACLE (Hips) ×2 IMPLANT
DERMABOND ADVANCED (GAUZE/BANDAGES/DRESSINGS) ×1
DERMABOND ADVANCED .7 DNX12 (GAUZE/BANDAGES/DRESSINGS) ×1 IMPLANT
DRAPE STERI IOBAN 125X83 (DRAPES) ×2 IMPLANT
DRAPE U-SHAPE 47X51 STRL (DRAPES) ×4 IMPLANT
DRESSING AQUACEL AG SP 3.5X10 (GAUZE/BANDAGES/DRESSINGS) ×1 IMPLANT
DRSG AQUACEL AG SP 3.5X10 (GAUZE/BANDAGES/DRESSINGS) ×2
DURAPREP 26ML APPLICATOR (WOUND CARE) ×2 IMPLANT
ELECT REM PT RETURN 15FT ADLT (MISCELLANEOUS) ×2 IMPLANT
ELIMINATOR HOLE APEX DEPUY (Hips) ×2 IMPLANT
GLOVE BIOGEL M STRL SZ7.5 (GLOVE) ×2 IMPLANT
GLOVE BIOGEL PI IND STRL 7.5 (GLOVE) ×1 IMPLANT
GLOVE BIOGEL PI IND STRL 8.5 (GLOVE) ×1 IMPLANT
GLOVE BIOGEL PI INDICATOR 7.5 (GLOVE) ×1
GLOVE BIOGEL PI INDICATOR 8.5 (GLOVE) ×1
GLOVE ECLIPSE 8.0 STRL XLNG CF (GLOVE) ×4 IMPLANT
GLOVE ORTHO TXT STRL SZ7.5 (GLOVE) ×2 IMPLANT
GOWN STRL REUS W/TWL 2XL LVL3 (GOWN DISPOSABLE) ×2 IMPLANT
GOWN STRL REUS W/TWL LRG LVL3 (GOWN DISPOSABLE) ×2 IMPLANT
HEAD CERAMIC 36 PLUS5 (Hips) ×2 IMPLANT
HOLDER FOLEY CATH W/STRAP (MISCELLANEOUS) ×2 IMPLANT
LINER NEUTRAL 52X36MM PLUS 4 (Liner) ×2 IMPLANT
PACK ANTERIOR HIP CUSTOM (KITS) ×2 IMPLANT
SCREW 6.5MMX30MM (Screw) ×2 IMPLANT
STEM FEMORAL SZ6 HIGH ACTIS (Stem) ×2 IMPLANT
SUT MNCRL AB 4-0 PS2 18 (SUTURE) ×2 IMPLANT
SUT STRATAFIX 0 PDS 27 VIOLET (SUTURE) ×2
SUT VIC AB 1 CT1 36 (SUTURE) ×6 IMPLANT
SUT VIC AB 2-0 CT1 27 (SUTURE) ×2
SUT VIC AB 2-0 CT1 TAPERPNT 27 (SUTURE) ×2 IMPLANT
SUTURE STRATFX 0 PDS 27 VIOLET (SUTURE) ×1 IMPLANT
TRAY FOLEY MTR SLVR 16FR STAT (SET/KITS/TRAYS/PACK) IMPLANT
WATER STERILE IRR 1000ML POUR (IV SOLUTION) ×2 IMPLANT
YANKAUER SUCT BULB TIP 10FT TU (MISCELLANEOUS) ×2 IMPLANT

## 2018-08-27 NOTE — Transfer of Care (Signed)
Immediate Anesthesia Transfer of Care Note  Patient: Suzanne Gibson  Procedure(s) Performed: Procedure(s) with comments: TOTAL HIP ARTHROPLASTY ANTERIOR APPROACH (Left) - 70 minutes  Patient Location: PACU  Anesthesia Type:Spinal  Level of Consciousness:  sedated, patient cooperative and responds to stimulation  Airway & Oxygen Therapy:Patient Spontanous Breathing and Patient connected to face mask oxgen  Post-op Assessment:  Report given to PACU RN and Post -op Vital signs reviewed and stable  Post vital signs:  Reviewed and stable  Last Vitals:  Vitals:   08/27/18 0635  BP: 130/78  Pulse: 60  Resp: 16  Temp: 36.7 C  SpO2: 100%    Complications: No apparent anesthesia complications

## 2018-08-27 NOTE — Anesthesia Postprocedure Evaluation (Signed)
Anesthesia Post Note  Patient: Suzanne Gibson  Procedure(s) Performed: TOTAL HIP ARTHROPLASTY ANTERIOR APPROACH (Left Hip)     Patient location during evaluation: PACU Anesthesia Type: MAC Level of consciousness: awake and alert Pain management: pain level controlled Vital Signs Assessment: post-procedure vital signs reviewed and stable Respiratory status: spontaneous breathing, nonlabored ventilation, respiratory function stable and patient connected to nasal cannula oxygen Cardiovascular status: stable and blood pressure returned to baseline Postop Assessment: no apparent nausea or vomiting Anesthetic complications: no    Last Vitals:  Vitals:   08/27/18 1100 08/27/18 1200  BP: 115/81 116/63  Pulse: (!) 54 61  Resp: 15 13  Temp: (!) 36.3 C (!) 36.3 C  SpO2: 100% 100%    Last Pain:  Vitals:   08/27/18 1100  TempSrc:   PainSc: [place]

## 2018-08-27 NOTE — Discharge Instructions (Signed)

## 2018-08-27 NOTE — Interval H&P Note (Signed)
History and Physical Interval Note:  08/27/2018 6:57 AM  Suzanne Gibson  has presented today for surgery, with the diagnosis of Left hip Osteoarthritis  The various methods of treatment have been discussed with the patient and family. After consideration of risks, benefits and other options for treatment, the patient has consented to  Procedure(s) with comments: TOTAL HIP ARTHROPLASTY ANTERIOR APPROACH (Left) - 70 minutes as a surgical intervention .  The patient's history has been reviewed, patient examined, no change in status, stable for surgery.  I have reviewed the patient's chart and labs.  Questions were answered to the patient's satisfaction.     Shelda Pal

## 2018-08-27 NOTE — Anesthesia Procedure Notes (Signed)
Spinal  Patient location during procedure: OR Start time: 08/27/2018 8:25 AM End time: 08/27/2018 8:30 AM Reason for block: at surgeon's request Staffing Resident/CRNA: Anne Fu, CRNA Performed: resident/CRNA  Preanesthetic Checklist Completed: patient identified, site marked, surgical consent, pre-op evaluation, timeout performed, IV checked, risks and benefits discussed and monitors and equipment checked Spinal Block Patient position: sitting Prep: DuraPrep Patient monitoring: heart rate, continuous pulse ox and blood pressure Approach: midline Location: L2-3 Injection technique: single-shot Needle Needle type: Pencan  Needle gauge: 24 G Needle length: 9 cm Assessment Sensory level: T6 Additional Notes  Functioning IV was confirmed and monitors were applied. Expiration date of kit checked and confirmed. Sterile prep and drape, including hand hygiene and sterile gloves were used. The patient was positioned and the spine was prepped. The skin was anesthetized with lidocaine.  Free flow of clear CSF was obtained prior to injecting local anesthetic into the CSF X 1 attempt.  The spinal needle aspirated freely following injection.  The needle was carefully withdrawn. Patient tolerated procedure well, without complications. Loss of motor and sensory on exam post injection.

## 2018-08-27 NOTE — Anesthesia Preprocedure Evaluation (Signed)
Anesthesia Evaluation  Patient identified by MRN, date of birth, ID band Patient awake    Reviewed: Allergy & Precautions, H&P , NPO status , Patient's Chart, lab work & pertinent test results  Airway Mallampati: II   Neck ROM: full    Dental   Pulmonary neg pulmonary ROS,    breath sounds clear to auscultation       Cardiovascular negative cardio ROS   Rhythm:regular Rate:Normal     Neuro/Psych    GI/Hepatic   Endo/Other  Hypothyroidism   Renal/GU      Musculoskeletal  (+) Arthritis , Osteoarthritis,    Abdominal   Peds  Hematology   Anesthesia Other Findings   Reproductive/Obstetrics                             Anesthesia Physical Anesthesia Plan  ASA: II  Anesthesia Plan: Spinal and MAC   Post-op Pain Management:    Induction: Intravenous  PONV Risk Score and Plan: 2 and Propofol infusion, Ondansetron and Treatment may vary due to age or medical condition  Airway Management Planned: Simple Face Mask  Additional Equipment:   Intra-op Plan:   Post-operative Plan:   Informed Consent: I have reviewed the patients History and Physical, chart, labs and discussed the procedure including the risks, benefits and alternatives for the proposed anesthesia with the patient or authorized representative who has indicated his/her understanding and acceptance.       Plan Discussed with: CRNA, Anesthesiologist and Surgeon  Anesthesia Plan Comments:         Anesthesia Quick Evaluation

## 2018-08-27 NOTE — Op Note (Signed)
NAME:  Suzanne Gibson                ACCOUNT NO.: [id number]      MEDICAL RECORD NO.: [id number]      FACILITY:  [facility]      PHYSICIAN:  Shelda Pal  DATE OF BIRTH:  01-04-1951     DATE OF PROCEDURE:  08/27/2018                                 OPERATIVE REPORT         PREOPERATIVE DIAGNOSIS: Left  hip osteoarthritis.      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      SURGEON:  Madlyn Frankel. Charlann Boxer, M.D.      ASSISTANT:  Lanney Gins, PA-C     ANESTHESIA:  Spinal.      SPECIMENS:  None.      COMPLICATIONS:  None.      BLOOD LOSS:  300 cc     DRAINS:  None.      INDICATION OF THE PROCEDURE:  Suzanne Gibson is a 68 y.o. female who had   presented to office for evaluation of left hip pain.  Radiographs revealed   progressive degenerative changes with bone-on-bone   articulation of the  hip joint, including subchondral cystic changes and osteophytes.  The patient had painful limited range of   motion significantly affecting their overall quality of life and function.  The patient was failing to    respond to conservative measures including medications and/or injections and activity modification and at this point was ready   to proceed with more definitive measures.  Consent was obtained for   benefit of pain relief.  Specific risks of infection, DVT, component   failure, dislocation, neurovascular injury, and need for revision surgery were reviewed in the office as well discussion of   the anterior versus posterior approach were reviewed.     PROCEDURE IN DETAIL:  The patient was brought to operative theater.   Once adequate anesthesia,  preoperative antibiotics, 2 gm of Ancef, 1 gm of Tranexamic Acid, and 10 mg of Decadron were administered, the patient was positioned supine on the [employer] table.  Once the patient was safely positioned with adequate padding of boney prominences we predraped out the hip, and used fluoroscopy to confirm orientation of the pelvis.      The left hip was then prepped and draped from proximal iliac crest to   mid thigh with a shower curtain technique.      Time-out was performed identifying the patient, planned procedure, and the appropriate extremity.     An incision was then made 2 cm lateral to the   anterior superior iliac spine extending over the orientation of the   tensor fascia lata muscle and sharp dissection was carried down to the   fascia of the muscle.      The fascia was then incised.  The muscle belly was identified and swept   laterally and retractor placed along the superior neck.  Following  cauterization of the circumflex vessels and removing some pericapsular   fat, a second cobra retractor was placed on the inferior neck.  A T-capsulotomy was made along the line of the   superior neck to the trochanteric fossa, then extended proximally and   distally.  Tag sutures were placed and the retractors were then placed   intracapsular.  We then identified the trochanteric fossa and   orientation of my neck cut and then made a neck osteotomy with the femur on traction.  The femoral   head was removed without difficulty or complication.  Traction was let   off and retractors were placed posterior and anterior around the   acetabulum.      The labrum and foveal tissue were debrided.  I began reaming with a 46 mm   reamer and reamed up to 51 mm reamer with good bony bed preparation and a 52 mm  cup was chosen.  There was a large acetabular cyst within the superior ilium which was debrided and then pack with femoral head autograft bone graft.  The final 52 mm Pinnacle cup was then  impacted under fluoroscopy to confirm the depth of penetration and orientation with respect to   Abduction and forward flexion.  A screw was placed into the ilium followed by the hole eliminator.  The final   36+4 neutral Altrex liner was impacted with good visualized rim fit.  The cup was positioned anatomically within the acetabular portion of the pelvis.      At this point, the femur was rolled to 100 degrees.  Further capsule was   released off the inferior aspect of the femoral neck.  I then   released the superior capsule proximally.  With the leg in a neutral position the hook was placed laterally   along the femur under the vastus lateralis origin and elevated manually and then held in position using the hook attachment on the bed.  The leg was then extended and adducted with the leg rolled to 100   degrees of external rotation.  Retractors were placed along the medial calcar and posteriorly over the greater trochanter.  Once the proximal femur was fully   exposed, I used a box osteotome to set orientation.  I then began   broaching with the starting chili pepper broach and passed this by hand and then broached up to 6.  With the 6 broach in place I chose a high offset neck and did several trial reductions.  The offset was appropriate, leg lengths   appeared to be equal best matched with the +5 head ball trial confirmed radiographically.   Given these findings, I went ahead and dislocated the hip, repositioned all   retractors and positioned the right hip in the extended and abducted position.  The final 6 Hi Actis stem was   chosen and it was impacted down to the level of neck cut.  Based on this   and the trial reductions, a final 36+5 delta ceramic ball was chosen and   impacted onto a clean and dry trunnion, and the hip was reduced.  The   hip had been irrigated throughout the case again at this point.  I did   reapproximate the superior capsular leaflet to the anterior leaflet    using #1 Vicryl.  The fascia of the   tensor fascia lata muscle was then reapproximated using #1 Vicryl and #0 Stratafix sutures.  The   remaining wound was closed with 2-0  Vicryl and running 4-0 Monocryl.   The hip was cleaned, dried, and dressed sterilely using Dermabond and   Aquacel dressing.  The patient was then brought   to recovery room in stable condition tolerating the procedure well.    Lanney Gins, PA-C was present for the entirety of the case involved from   preoperative positioning, perioperative retractor management, general   facilitation of the case, as well as primary wound closure as assistant.            Madlyn Frankel Charlann Boxer, M.D.        08/27/2018 9:51 AM

## 2018-08-27 NOTE — Evaluation (Signed)
Physical Therapy Evaluation Patient Details Name: Suzanne Gibson MRN: [id number] DOB: 05/25/1951 Today's Date: 08/27/2018   History of Present Illness  68 yo female s/p L DA-THA on 08/27/18. PMH unremarkable.   Clinical Impression  Pt presents with L hip pain, post-surgical L hip weakness, difficulty performing bed mobility, increased time and effort to perform mobility tasks, and decreased activity tolerance due to pain. Pt to benefit from acute PT to address deficits. Pt ambulated 50 ft with RW with min guard assist, verbal cuing provided throughout for form and safety. Pt educated on ankle pumps (20/hour) to perform this afternoon/evening to increase circulation, to pt's tolerance and limited by pain. PT to progress mobility as tolerated, and will continue to follow acutely.        Follow Up Recommendations Follow surgeon's recommendation for DC plan and follow-up therapies;Supervision for mobility/OOB    Equipment Recommendations  None recommended by PT    Recommendations for Other Services       Precautions / Restrictions Precautions Precautions: Fall Restrictions Weight Bearing Restrictions: No LLE Weight Bearing: Weight bearing as tolerated      Mobility  Bed Mobility Overal bed mobility: Needs Assistance Bed Mobility: Supine to Sit     Supine to sit: Min assist;HOB elevated     General bed mobility comments: Min assist for LLE lifting and translation to EOB, increased time and effort and VC for sequencing.   Transfers Overall transfer level: Needs assistance Equipment used: Rolling walker (2 wheeled) Transfers: Sit to/from Stand Sit to Stand: From elevated surface;Min guard         General transfer comment: Min guard for safety. Verbal cuing for safe hand placement with rising.   Ambulation/Gait Ambulation/Gait assistance: Min guard;+2 safety/equipment(chair follow) Gait Distance (Feet): 50 Feet Assistive device: Rolling walker (2 wheeled) Gait  Pattern/deviations: Step-to pattern;Decreased weight shift to left;Decreased stride length Gait velocity: decr    General Gait Details: Min guard for safety. Verbal cuing for sequencing, placement in RW, turning. Pt with very good posture.   Stairs            Wheelchair Mobility    Modified Rankin (Stroke Patients Only)       Balance Overall balance assessment: Mild deficits observed, not formally tested                                           Pertinent Vitals/Pain Pain Assessment: 0-10 Pain Score: 3  Pain Location: L hip  Pain Descriptors / Indicators: Sore;Aching Pain Intervention(s): Limited activity within patient's tolerance;Repositioned;Monitored during session;Ice applied    Home Living Family/patient expects to be discharged to:: Private residence Living Arrangements: Other relatives(will be staying with partner in niece's guesthouse for 1 month, pt is from [place]) Available Help at Discharge: Family;Available 24 hours/day Type of Home: House Home Access: Ramped entrance;Stairs to enter Entrance Stairs-Rails: None Entrance Stairs-Number of Steps: 3 Home Layout: One level Home Equipment: Walker - 2 wheels;Cane - single point;Tub bench;Toilet riser      Prior Function Level of Independence: Independent         Comments: Pt is from [place], enjoys yoga and being active      Hand Dominance   Dominant Hand: Right    Extremity/Trunk Assessment   Upper Extremity Assessment Upper Extremity Assessment: Overall WFL for tasks assessed    Lower Extremity Assessment Lower Extremity Assessment: Overall WFL for  tasks assessed;LLE deficits/detail LLE Deficits / Details: suspected post-surgical weakness; able to perform ankle pumps, quad set, heel slides to 60* LLE Sensation: WNL    Cervical / Trunk Assessment Cervical / Trunk Assessment: Normal  Communication   Communication: No difficulties  Cognition Arousal/Alertness:  Awake/alert Behavior During Therapy: WFL for tasks assessed/performed Overall Cognitive Status: Within Functional Limits for tasks assessed                                        General Comments      Exercises     Assessment/Plan    PT Assessment Patient needs continued PT services  PT Problem List Decreased strength;Pain;Decreased activity tolerance;Decreased knowledge of use of DME;Decreased balance;Decreased mobility       PT Treatment Interventions DME instruction;Therapeutic activities;Gait training;Therapeutic exercise;Patient/family education;Stair training;Balance training;Functional mobility training    PT Goals (Current goals can be found in the Care Plan section)  Acute Rehab PT Goals Patient Stated Goal: return to PLOF PT Goal Formulation: With patient Time For Goal Achievement: 09/03/18 Potential to Achieve Goals: Good    Frequency 7X/week   Barriers to discharge        Co-evaluation               AM-PAC PT "6 Clicks" Mobility  Outcome Measure Help needed turning from your back to your side while in a flat bed without using bedrails?: A Little Help needed moving from lying on your back to sitting on the side of a flat bed without using bedrails?: A Little Help needed moving to and from a bed to a chair (including a wheelchair)?: A Little Help needed standing up from a chair using your arms (e.g., wheelchair or bedside chair)?: A Little Help needed to walk in hospital room?: A Little Help needed climbing 3-5 steps with a railing? : A Little 6 Click Score: 18    End of Session Equipment Utilized During Treatment: Gait belt Activity Tolerance: Patient tolerated treatment well Patient left: in chair;with chair alarm set;with call bell/phone within reach;with family/visitor present;with SCD's reapplied Nurse Communication: Mobility status PT Visit Diagnosis: Other abnormalities of gait and mobility (R26.89);Difficulty in walking, not  elsewhere classified (R26.2)    Time: 1535-1610 PT Time Calculation (min) (ACUTE ONLY): 35 min   Charges:   PT Evaluation $PT Eval Low Complexity: 1 Low PT Treatments $Gait Training: 8-22 mins        Nicola Police, PT Acute Rehabilitation Services Pager [phone]  Office [phone]  Tyrone Apple D Despina Hidden 08/27/2018, 6:44 PM

## 2018-08-28 ENCOUNTER — Encounter (HOSPITAL_COMMUNITY): Payer: Self-pay | Admitting: *Deleted

## 2018-08-28 DIAGNOSIS — M1612 Unilateral primary osteoarthritis, left hip: Secondary | ICD-10-CM | POA: Diagnosis not present

## 2018-08-28 LAB — CBC
HCT: 32.5 % — ABNORMAL LOW (ref 36.0–46.0)
Hemoglobin: 10.6 g/dL — ABNORMAL LOW (ref 12.0–15.0)
MCH: 31.4 pg (ref 26.0–34.0)
MCHC: 32.6 g/dL (ref 30.0–36.0)
MCV: 96.2 fL (ref 80.0–100.0)
PLATELETS: 217 10*3/uL (ref 150–400)
RBC: 3.38 MIL/uL — ABNORMAL LOW (ref 3.87–5.11)
RDW: 13.7 % (ref 11.5–15.5)
WBC: 12.8 10*3/uL — ABNORMAL HIGH (ref 4.0–10.5)
nRBC: 0 % (ref 0.0–0.2)

## 2018-08-28 LAB — BASIC METABOLIC PANEL
Anion gap: 9 (ref 5–15)
BUN: 18 mg/dL (ref 8–23)
CALCIUM: 8.4 mg/dL — AB (ref 8.9–10.3)
CO2: 24 mmol/L (ref 22–32)
Chloride: 106 mmol/L (ref 98–111)
Creatinine, Ser: 0.64 mg/dL (ref 0.44–1.00)
GFR calc Af Amer: >60 mL/min (ref >60)
GFR calc non Af Amer: >60 mL/min (ref >60)
Glucose, Bld: 113 mg/dL — ABNORMAL HIGH (ref 70–99)
Potassium: 4.1 mmol/L (ref 3.5–5.1)
SODIUM: 139 mmol/L (ref 135–145)

## 2018-08-28 MED ORDER — ACETAMINOPHEN 500 MG PO TABS
1000.0000 mg | ORAL_TABLET | Freq: Once | ORAL | Status: AC
  Administered 2018-08-28: 1000 mg via ORAL
  Filled 2018-08-28: qty 2

## 2018-08-28 NOTE — Progress Notes (Signed)
Physical Therapy Treatment Patient Details Name: Suzanne Gibson MRN: [id number] DOB: 04/24/1951 Today's Date: 08/28/2018    History of Present Illness 68 yo female s/p L DA-THA on 08/27/18. PMH unremarkable.     PT Comments    Pt progressing well with mobility.  Spouse present to review car transfers, stairs and home therex program with progression - written instruction provided.   Follow Up Recommendations  Follow surgeon's recommendation for DC plan and follow-up therapies;Supervision for mobility/OOB     Equipment Recommendations  None recommended by PT    Recommendations for Other Services       Precautions / Restrictions Precautions Precautions: Fall Restrictions Weight Bearing Restrictions: No LLE Weight Bearing: Weight bearing as tolerated    Mobility  Bed Mobility Overal bed mobility: Needs Assistance Bed Mobility: Supine to Sit     Supine to sit: Min guard     General bed mobility comments: cues for sequence and use of R LE to self assist  Transfers Overall transfer level: Needs assistance Equipment used: Rolling walker (2 wheeled) Transfers: Sit to/from Stand Sit to Stand: Min guard;Supervision         General transfer comment: for safety; cues for UE/LE placement  Ambulation/Gait Ambulation/Gait assistance: Min guard;Supervision Gait Distance (Feet): 150 Feet Assistive device: Rolling walker (2 wheeled) Gait Pattern/deviations: Step-to pattern;Step-through pattern;Decreased step length - right;Decreased step length - left;Shuffle;Trunk flexed Gait velocity: decr    General Gait Details: Min guard for safety. Verbal cuing for sequencing, placement in RW, turning. Pt with very good posture.    Stairs Stairs: Yes Stairs assistance: Min assist Stair Management: No rails;Step to pattern;Backwards;With walker Number of Stairs: 4 General stair comments: cues for sequence and foot/RW placement; spouse assisting and written instruction  provided   Wheelchair Mobility    Modified Rankin (Stroke Patients Only)       Balance Overall balance assessment: Mild deficits observed, not formally tested                                          Cognition Arousal/Alertness: Awake/alert Behavior During Therapy: WFL for tasks assessed/performed Overall Cognitive Status: Within Functional Limits for tasks assessed                                        Exercises Total Joint Exercises Ankle Circles/Pumps: AROM;Both;20 reps;Supine Quad Sets: AROM;Both;10 reps;Supine Heel Slides: AAROM;Right;20 reps;Supine Hip ABduction/ADduction: AAROM;Right;15 reps;Supine Long Arc Quad: AROM;Right;10 reps;Supine    General Comments        Pertinent Vitals/Pain Pain Assessment: 0-10 Pain Score: 5  Pain Location: L hip  Pain Descriptors / Indicators: Sore;Aching Pain Intervention(s): Limited activity within patient's tolerance;Monitored during session    Home Living Family/patient expects to be discharged to:: Private residence Living Arrangements: Spouse/significant other;Other relatives Available Help at Discharge: Family;Available 24 hours/day         Home Equipment: Bedside commode;Shower seat Additional Comments: boyfriend will be there for a week; then niece will help intermittently    Prior Function Level of Independence: Independent      Comments: Pt is from [place], enjoys yoga and being active    PT Goals (current goals can now be found in the care plan section) Acute Rehab PT Goals Patient Stated Goal: return to PLOF PT Goal Formulation: With  patient Time For Goal Achievement: 09/03/18 Potential to Achieve Goals: Good Progress towards PT goals: Progressing toward goals    Frequency    7X/week      PT Plan Current plan remains appropriate    Co-evaluation              AM-PAC PT "6 Clicks" Mobility   Outcome Measure  Help needed turning from your back to your  side while in a flat bed without using bedrails?: A Little Help needed moving from lying on your back to sitting on the side of a flat bed without using bedrails?: A Little Help needed moving to and from a bed to a chair (including a wheelchair)?: A Little Help needed standing up from a chair using your arms (e.g., wheelchair or bedside chair)?: A Little Help needed to walk in hospital room?: A Little Help needed climbing 3-5 steps with a railing? : A Little 6 Click Score: 18    End of Session Equipment Utilized During Treatment: Gait belt Activity Tolerance: Patient tolerated treatment well Patient left: in chair;with chair alarm set;with call bell/phone within reach;with family/visitor present;with SCD's reapplied Nurse Communication: Mobility status PT Visit Diagnosis: Other abnormalities of gait and mobility (R26.89);Difficulty in walking, not elsewhere classified (R26.2)     Time: 7893-8101 PT Time Calculation (min) (ACUTE ONLY): 38 min  Charges:  $Gait Training: 8-22 mins $Therapeutic Exercise: 8-22 mins $Therapeutic Activity: 8-22 mins                     Mauro Kaufmann PT Acute Rehabilitation Services Pager [phone] Office [phone]    Suzanne Gibson 08/28/2018, 1:29 PM

## 2018-08-28 NOTE — Plan of Care (Signed)
Plan of care reviewed and discussed with the patient. 

## 2018-08-28 NOTE — Progress Notes (Signed)
Physical Therapy Treatment Patient Details Name: Suzanne Gibson MRN: [id number] DOB: 16-Sep-1950 Today's Date: 08/28/2018    History of Present Illness 68 yo female s/p L DA-THA on 08/27/18. PMH unremarkable.     PT Comments    Pt motivated and progressing well with mobility.  Will review home therex and stairs with significant other at next session.  Follow Up Recommendations  Follow surgeon's recommendation for DC plan and follow-up therapies;Supervision for mobility/OOB     Equipment Recommendations  None recommended by PT    Recommendations for Other Services       Precautions / Restrictions Precautions Precautions: Fall Restrictions Weight Bearing Restrictions: No LLE Weight Bearing: Weight bearing as tolerated    Mobility  Bed Mobility Overal bed mobility: Needs Assistance Bed Mobility: Supine to Sit     Supine to sit: Min assist;Min guard     General bed mobility comments: cues for sequence and use of R LE to self assist  Transfers Overall transfer level: Needs assistance Equipment used: Rolling walker (2 wheeled) Transfers: Sit to/from Stand Sit to Stand: Min guard;Supervision         General transfer comment: for safety; cues for UE/LE placement  Ambulation/Gait Ambulation/Gait assistance: Min guard Gait Distance (Feet): 150 Feet Assistive device: Rolling walker (2 wheeled) Gait Pattern/deviations: Step-to pattern;Step-through pattern;Decreased step length - right;Decreased step length - left;Shuffle;Trunk flexed Gait velocity: decr    General Gait Details: Min guard for safety. Verbal cuing for sequencing, placement in RW, turning. Pt with very good posture.    Stairs Stairs: Yes Stairs assistance: Min assist Stair Management: No rails;Step to pattern;Backwards;With walker Number of Stairs: 2 General stair comments: cues for sequence and foot/RW placement   Wheelchair Mobility    Modified Rankin (Stroke Patients Only)       Balance  Overall balance assessment: Mild deficits observed, not formally tested                                          Cognition Arousal/Alertness: Awake/alert Behavior During Therapy: WFL for tasks assessed/performed Overall Cognitive Status: Within Functional Limits for tasks assessed                                        Exercises Total Joint Exercises Ankle Circles/Pumps: AROM;Both;20 reps;Supine Quad Sets: AROM;Both;10 reps;Supine Heel Slides: AAROM;Right;20 reps;Supine Hip ABduction/ADduction: AAROM;Right;15 reps;Supine Long Arc Quad: AROM;Right;10 reps;Supine    General Comments        Pertinent Vitals/Pain Pain Assessment: 0-10 Pain Score: 5  Pain Location: L hip  Pain Descriptors / Indicators: Sore;Aching Pain Intervention(s): Limited activity within patient's tolerance;Monitored during session;Premedicated before session    Home Living Family/patient expects to be discharged to:: Private residence Living Arrangements: Spouse/significant other;Other relatives Available Help at Discharge: Family;Available 24 hours/day         Home Equipment: Bedside commode;Shower seat Additional Comments: boyfriend will be there for a week; then niece will help intermittently    Prior Function Level of Independence: Independent      Comments: Pt is from [place], enjoys yoga and being active    PT Goals (current goals can now be found in the care plan section) Acute Rehab PT Goals Patient Stated Goal: return to PLOF PT Goal Formulation: With patient Time For Goal Achievement: 09/03/18 Potential  to Achieve Goals: Good Progress towards PT goals: Progressing toward goals    Frequency    7X/week      PT Plan Current plan remains appropriate    Co-evaluation              AM-PAC PT "6 Clicks" Mobility   Outcome Measure  Help needed turning from your back to your side while in a flat bed without using bedrails?: A Little Help  needed moving from lying on your back to sitting on the side of a flat bed without using bedrails?: A Little Help needed moving to and from a bed to a chair (including a wheelchair)?: A Little Help needed standing up from a chair using your arms (e.g., wheelchair or bedside chair)?: A Little Help needed to walk in hospital room?: A Little Help needed climbing 3-5 steps with a railing? : A Little 6 Click Score: 18    End of Session Equipment Utilized During Treatment: Gait belt Activity Tolerance: Patient tolerated treatment well Patient left: in chair;with chair alarm set;with call bell/phone within reach;with family/visitor present;with SCD's reapplied Nurse Communication: Mobility status PT Visit Diagnosis: Other abnormalities of gait and mobility (R26.89);Difficulty in walking, not elsewhere classified (R26.2)     Time: 7253-66441041-1120 PT Time Calculation (min) (ACUTE ONLY): 39 min  Charges:  $Gait Training: 8-22 mins $Therapeutic Exercise: 8-22 mins $Therapeutic Activity: 8-22 mins                     Mauro KaufmannHunter Jyoti Harju PT Acute Rehabilitation Services Pager [phone] Office [phone]    [facility] Central[facility]AD[facility]W,Kosisochukwu [place] 08/28/2018, 12:35 PM

## 2018-08-28 NOTE — Progress Notes (Signed)
     Subjective: 1 Day Post-Op Procedure(s) (LRB): TOTAL HIP ARTHROPLASTY ANTERIOR APPROACH (Left)   Patient reports pain as mild, pain controlled primary with APAP.  States that she really hasn't needed an tramadol yet.  No events throughout the night.  Feels that she is doing well.  Ready to be discharged home.   Objective:   VITALS:   Vitals:   08/28/18 0202 08/28/18 0648  BP: 114/68 128/72  Pulse: 71 60  Resp: 14 14  Temp: 97.9 F (36.6 C) 98.2 F (36.8 C)  SpO2: 97% 100%    Dorsiflexion/Plantar flexion intact Incision: dressing C/D/I No cellulitis present Compartment soft  LABS Recent Labs    08/28/18 0525  HGB 10.6*  HCT 32.5*  WBC 12.8*  PLT 217    Recent Labs    08/28/18 0525  NA 139  K 4.1  BUN 18  CREATININE 0.64  GLUCOSE 113*     Assessment/Plan: 1 Day Post-Op Procedure(s) (LRB): TOTAL HIP ARTHROPLASTY ANTERIOR APPROACH (Left) Foley cath d/c'ed Advance diet Up with therapy D/C IV fluids Discharge home [facility] Follow up with OLIN,Wilder Kurowski D in 2 weeks.  Contact information:  [facility]( [facility]) [address] [place] [id number] [phone]          Anastasio AuerbachMatthew S. Latayna Ritchie   PAC  08/28/2018, 9:21 AM

## 2018-08-28 NOTE — Evaluation (Signed)
Occupational Therapy Evaluation Patient Details Name: Suzanne Gibson MRN: [id number] DOB: 1951/01/06 Today's Date: 08/28/2018    History of Present Illness 68 yo female s/p L DA-THA on 08/27/18. PMH unremarkable.    Clinical Impression   Pt was admitted for the above sx. All education was completed. No further OT is needed at this time     Follow Up Recommendations  No OT follow up;Supervision/Assistance - 24 hour(initial)    Equipment Recommendations  None recommended by OT(has 3:1 and tub seat)    Recommendations for Other Services       Precautions / Restrictions Precautions Precautions: Fall Restrictions LLE Weight Bearing: Weight bearing as tolerated      Mobility Bed Mobility               General bed mobility comments: oob  Transfers   Equipment used: Rolling walker (2 wheeled)   Sit to Stand: Min guard;Supervision         General transfer comment: for safety; cues for UE/LE placement    Balance                                           ADL either performed or assessed with clinical judgement   ADL Overall ADL's : Needs assistance/impaired     Grooming: Wash/dry hands;Supervision/safety   Upper Body Bathing: Set up   Lower Body Bathing: Minimal assistance   Upper Body Dressing : Set up   Lower Body Dressing: Moderate assistance;Maximal assistance   Toilet Transfer: Min guard;Ambulation;Comfort height toilet;Grab bars;RW   Toileting- Architect and Hygiene: Min guard;Sit to/from stand   Tub/ Shower Transfer: Tub transfer;Min guard;Ambulation     General ADL Comments: pt can Suzanne pants without AD, but educated on reacher to make it easier for her. She will have assistance as needed. Ambulated to bathroom and performed toileting and grooming.  Brought to gym and practiced tub transfer     Vision         Perception     Praxis      Pertinent Vitals/Pain Pain Score: 4  Pain Location: L hip  Pain  Descriptors / Indicators: Sore;Aching Pain Intervention(s): Limited activity within patient's tolerance;Monitored during session;Premedicated before session;Repositioned     Hand Dominance     Extremity/Trunk Assessment Upper Extremity Assessment Upper Extremity Assessment: Overall WFL for tasks assessed           Communication Communication Communication: No difficulties   Cognition Arousal/Alertness: Awake/alert Behavior During Therapy: WFL for tasks assessed/performed Overall Cognitive Status: Within Functional Limits for tasks assessed                                     General Comments       Exercises     Shoulder Instructions      Home Living Family/patient expects to be discharged to:: Private residence Living Arrangements: Spouse/significant other;Other relatives Available Help at Discharge: Family;Available 24 hours/day               Bathroom Shower/Tub: Chief Strategy Officer: Standard     Home Equipment: Bedside commode;Shower seat   Additional Comments: boyfriend will be there for a week; then niece will help intermittently      Prior Functioning/Environment Level of Independence: Independent  Comments: Pt is from [place], enjoys yoga and being active         OT Problem List:        OT Treatment/Interventions:      OT Goals(Current goals can be found in the care plan section) Acute Rehab OT Goals Patient Stated Goal: return to PLOF OT Goal Formulation: All assessment and education complete, DC therapy  OT Frequency:     Barriers to D/C:            Co-evaluation              AM-PAC OT "6 Clicks" Daily Activity     Outcome Measure Help from another person eating meals?: None Help from another person taking care of personal grooming?: A Little Help from another person toileting, which includes using toliet, bedpan, or urinal?: A Little Help from another person bathing (including washing,  rinsing, drying)?: A Little Help from another person to put on and taking off regular upper body clothing?: A Little Help from another person to put on and taking off regular lower body clothing?: A Lot 6 Click Score: 18   End of Session    Activity Tolerance: Patient tolerated treatment well Patient left: with call bell/phone within reach;in bed;with bed alarm set  OT Visit Diagnosis: Pain Pain - Right/Left: Left Pain - part of body: Hip                Time: 7340-3709 OT Time Calculation (min): 34 min Charges:  OT General Charges $OT Visit: 1 Visit OT Evaluation $OT Eval Low Complexity: 1 Low OT Treatments $Self Care/Home Management : 8-22 mins  Marica Otter, OTR/L Acute Rehabilitation Services [phone] [facility] pager [phone] office 08/28/2018  Suzanne Gibson 08/28/2018, 12:21 PM

## 2018-08-28 NOTE — Care Management Obs Status (Signed)
MEDICARE OBSERVATION STATUS NOTIFICATION   Patient Details  Name: HALLEIGH HARRELSON MRN: [id number] Date of Birth: 1950/12/12   Medicare Observation Status Notification Given:  Yes    Alexis Goodell, RN 08/28/2018, 10:16 AM

## 2018-08-29 ENCOUNTER — Encounter (HOSPITAL_COMMUNITY): Payer: Self-pay | Admitting: Orthopedic Surgery

## 2018-09-03 NOTE — Discharge Summary (Signed)
Physician Discharge Summary  Patient ID: JAELEY HEMSWORTH MRN: [id number] DOB/AGE: 1951/06/12 68 y.o.  Admit date: 08/27/2018 Discharge date: 08/28/2018   Procedures:  Procedure(s) (LRB): TOTAL HIP ARTHROPLASTY ANTERIOR APPROACH (Left)  Attending Physician:  Dr. Durene Romans   Admission Diagnoses:   Left hip primary OA / pain  Discharge Diagnoses:  Principal Problem:   S/P left THA, AA  Past Medical History:  Diagnosis Date  . Hypothyroidism   . Pneumonia     HPI:    Suzanne Gibson, 68 y.o. female, has a history of pain and functional disability in the left hip(s) due to arthritis and patient has failed non-surgical conservative treatments for greater than 12 weeks to include NSAID's and/or analgesics, supervised PT with diminished ADL's post treatment and activity modification.  Onset of symptoms was gradual starting 3-4 years ago with gradually worsening course since that time.The patient noted no past surgery on the left hip(s).  Patient currently rates pain in the left hip at 9 out of 10 with activity. Patient has worsening of pain with activity and weight bearing, trendelenberg gait, pain that interfers with activities of daily living and pain with passive range of motion. Patient has evidence of periarticular osteophytes and joint space narrowing by imaging studies. This condition presents safety issues increasing the risk of falls.  There is no current active infection.   Risks, benefits and expectations were discussed with the patient.  Risks including but not limited to the risk of anesthesia, blood clots, nerve damage, blood vessel damage, failure of the prosthesis, infection and up to and including death.  Patient understand the risks, benefits and expectations and wishes to proceed with surgery.   PCP: System, Pcp Not In   Discharged Condition: good  Hospital Course:  Patient underwent the above stated procedure on 08/27/2018. Patient tolerated the procedure well and  brought to the recovery room in good condition and subsequently to the floor.  POD #1 BP: 128/72 ; Pulse: 60 ; Temp: 98.2 F (36.8 C) ; Resp: 14 Patient reports pain as mild, pain controlled primary with APAP.  States that she really hasn't needed an tramadol yet.  No events throughout the night.  Feels that she is doing well.  Ready to be discharged home. Dorsiflexion/plantar flexion intact, incision: dressing C/D/I, no cellulitis present and compartment soft.   LABS  Basename    HGB     10.6  HCT     32.5    Discharge Exam: General appearance: alert, cooperative and no distress Extremities: Homans sign is negative, no sign of DVT, no edema, redness or tenderness in the calves or thighs and no ulcers, gangrene or trophic changes  Disposition:  Home with follow up in 2 weeks   Follow-up Information    Durene Romans, MD. Schedule an appointment as soon as possible for a visit in 2 weeks.   Specialty:  Orthopedic Surgery Contact information: [address] [place] [address] [phone]           Discharge Instructions    Call MD / Call 911   Complete by:  As directed    If you experience chest pain or shortness of breath, CALL 911 and be transported to the hospital emergency room.  If you develope a fever above 101 F, pus (white drainage) or increased drainage or redness at the wound, or calf pain, call your surgeon's office.   Change dressing   Complete by:  As directed  Maintain surgical dressing until follow up in the clinic. If the edges start to pull up, may reinforce with tape. If the dressing is no longer working, may remove and cover with gauze and tape, but must keep the area dry and clean.  Call with any questions or concerns.   Constipation Prevention   Complete by:  As directed    Drink plenty of fluids.  Prune juice may be helpful.  You may use a stool softener, such as Colace (over the counter) 100 mg twice a day.  Use MiraLax (over the  counter) for constipation as needed.   Diet - low sodium heart healthy   Complete by:  As directed    Discharge instructions   Complete by:  As directed    Maintain surgical dressing until follow up in the clinic. If the edges start to pull up, may reinforce with tape. If the dressing is no longer working, may remove and cover with gauze and tape, but must keep the area dry and clean.  Follow up in 2 weeks at [facility]. Call with any questions or concerns.   Increase activity slowly as tolerated   Complete by:  As directed    Weight bearing as tolerated with assist device (walker, cane, etc) as directed, use it as long as suggested by your surgeon or therapist, typically at least 4-6 weeks.   TED hose   Complete by:  As directed    Use stockings (TED hose) for 2 weeks on both leg(s).  You may remove them at night for sleeping.      Allergies as of 08/28/2018      Reactions   Levofloxacin Rash   Headache      Medication List    TAKE these medications   acetaminophen 500 MG tablet Commonly known as:  TYLENOL Take 2 tablets (1,000 mg total) by mouth every 8 (eight) hours.   aspirin 81 MG chewable tablet Commonly known as:  ASPIRIN CHILDRENS Chew 1 tablet (81 mg total) by mouth 2 (two) times daily for 30 days. Take for 4 weeks, then resume regular dose.   b complex vitamins capsule Take 1 capsule by mouth daily.   docusate sodium 100 MG capsule Commonly known as:  COLACE Take 1 capsule (100 mg total) by mouth 2 (two) times daily.   ferrous sulfate 325 (65 FE) MG tablet Commonly known as:  FERROUSUL Take 1 tablet (325 mg total) by mouth 3 (three) times daily with meals.   methocarbamol 500 MG tablet Commonly known as:  ROBAXIN Take 1 tablet (500 mg total) by mouth every 6 (six) hours as needed for muscle spasms.   multivitamin tablet Take 1 tablet by mouth daily.   NONFORMULARY OR COMPOUNDED ITEM Apply 1 application topically See admin instructions. Apply  topically 3 times daily six days a week.  Estrogen, Testosterone and Progesterone cream   polyethylene glycol packet Commonly known as:  MIRALAX / GLYCOLAX Take 17 g by mouth 2 (two) times daily.   Thyroid 48.75 MG Tabs Take 48.75 mg by mouth daily. 3/4 grain of armour thyroid   traMADol 50 MG tablet Commonly known as:  ULTRAM Take 1-2 tablets (50-100 mg total) by mouth every 6 (six) hours as needed.            Discharge Care Instructions  (From admission, onward)         Start     Ordered   08/28/18 0000  Change dressing    Comments:  Maintain surgical dressing until follow up in the clinic. If the edges start to pull up, may reinforce with tape. If the dressing is no longer working, may remove and cover with gauze and tape, but must keep the area dry and clean.  Call with any questions or concerns.   08/28/18 [id number]           Signed: Anastasio AuerbachMatthew S. Elim Peale   PA-C  09/03/2018, 11:09 AM

## 2018-09-17 ENCOUNTER — Other Ambulatory Visit: Payer: Self-pay

## 2018-09-17 ENCOUNTER — Encounter: Payer: Self-pay | Admitting: Physical Therapy

## 2018-09-17 ENCOUNTER — Ambulatory Visit: Payer: Medicare Other | Attending: Orthopedic Surgery | Admitting: Physical Therapy

## 2018-09-17 DIAGNOSIS — M25652 Stiffness of left hip, not elsewhere classified: Secondary | ICD-10-CM | POA: Diagnosis not present

## 2018-09-17 DIAGNOSIS — R262 Difficulty in walking, not elsewhere classified: Secondary | ICD-10-CM | POA: Diagnosis present

## 2018-09-17 DIAGNOSIS — M25552 Pain in left hip: Secondary | ICD-10-CM | POA: Diagnosis present

## 2018-09-17 NOTE — Therapy (Signed)
[facility] [address] [place], [place], [address] Phone: [phone]   Fax:  [phone]  Physical Therapy Evaluation  Patient Details  Name: Suzanne Gibson MRN: [id number] Date of Birth: [birth year] Referring Provider (PT): Durene Romans, MD   Encounter Date: 09/17/2018  PT End of Session - 09/17/18 2147    Visit Number  1    Number of Visits  8    Date for PT Re-Evaluation  10/22/18    PT Start Time  1300    PT Stop Time  1347    PT Time Calculation (min)  47 min    Activity Tolerance  Patient tolerated treatment well    Behavior During Therapy  [facility] for tasks assessed/performed       Past Medical History:  Diagnosis Date  . Hypothyroidism   . Pneumonia     Past Surgical History:  Procedure Laterality Date  . TONSILLECTOMY    . TOTAL HIP ARTHROPLASTY Left 08/27/2018   Procedure: TOTAL HIP ARTHROPLASTY ANTERIOR APPROACH;  Surgeon: Durene Romans, MD;  Location: [facility];  Service: Orthopedics;  Laterality: Left;  70 minutes    There were no vitals filed for this visit.   Subjective Assessment - 09/17/18 2142    Subjective  Patient arrives to physical therapy with reports of left hip pain secondary to a left hip replacement in the anterior approach on 08/27/2018. Patient reports hip pain and soreness but has been resting, icing and elevating to reduce pain and swelling. Patient reports being compliant with HEP provided by hospital as well as walking around home with her rolling walker.  Patient reports being independent but requires increased time to perform activities. Patient also noted with weakness especially when trying to raise the left leg. Patient's goals are to decrease pain, improve movement, and ambulate without an AD.    Pertinent History  left total hip replacement 08/27/2018    Limitations  Sitting;House hold activities;Walking;Standing    Patient Stated Goals  improve strength, improve walking, return to recreational  activities    Currently in Pain?  Yes    Pain Score  4     Pain Location  Hip    Pain Orientation  Left    Pain Descriptors / Indicators  Sore    Pain Type  Surgical pain    Pain Onset  1 to 4 weeks ago    Pain Frequency  Intermittent    Aggravating Factors   increase movement    Pain Relieving Factors  rest; tylenol    Effect of Pain on Daily Activities  increased time to perform activities         [facility] PT Assessment - 09/17/18 0001      Assessment   Medical Diagnosis  Presence of left artificial hip    Referring Provider (PT)  Durene Romans, MD    Onset Date/Surgical Date  08/27/18    Next MD Visit  March 19,2020    Prior Therapy  no      Precautions   Precautions  Anterior Hip      Restrictions   Weight Bearing Restrictions  No      Balance Screen   Has the patient fallen in the past 6 months  No    Has the patient had a decrease in activity level because of a fear of falling?   No    Is the patient reluctant to leave their home because of a fear of falling?   No  Home Environment   Living Environment  Private residence    Living Arrangements  Alone      Prior Function   Level of Independence  Independent with basic ADLs;Requires assistive device for independence      ROM / Strength   AROM / PROM / Strength  AROM;Strength      AROM   AROM Assessment Site  Hip    Right/Left Hip  Left    Left Hip Flexion  106    Left Hip External Rotation   20    Left Hip Internal Rotation   35    Left Hip ABduction  30      Strength   Strength Assessment Site  Hip;Knee    Right/Left Hip  Left    Left Hip Flexion  3/5    Left Hip ABduction  3+/5    Right/Left Knee  Left    Left Knee Flexion  4/5    Left Knee Extension  4+/5      Ambulation/Gait   Assistive device  Rolling walker    Gait Pattern  Step-through pattern;Decreased step length - right;Decreased stance time - left;Decreased stride length;Decreased hip/knee flexion - left;Trendelenburg                 Objective measurements completed on examination: See above findings.              PT Education - 09/17/18 2145    Education Details  bridges, hip adduction, supine clams, hip flexor stretch    Person(s) Educated  Patient    Methods  Explanation;Demonstration;Handout    Comprehension  Verbalized understanding          PT Long Term Goals - 09/17/18 2150      PT LONG TERM GOAL #1   Title  Patient will be independent with HEP    Time  4    Period  Weeks    Status  New      PT LONG TERM GOAL #2   Title  Patient will demonstrate 4/5 or greater left hip MMT to improve stability during functional tasks    Time  4    Period  Weeks    Status  New      PT LONG TERM GOAL #3   Title  Patient will improve LE functional strength and balance as noted by the ability to perform 5x sit to stand in 16 seconds or less.    Time  4    Period  Weeks    Status  New      PT LONG TERM GOAL #4   Title  Patinet will ambulate community distance with no AD and limited gait deviations.    Time  4    Period  Weeks    Status  New             Plan - 09/17/18 2148    Clinical Impression Statement  Patient is a 68 year old female who presents to physical therapy with decreased left hip ROM, decreased left hip strength, and with difficulty walking secondary to a left total hip replacement in the anterior approach on 08/27/2018. Patient's incision healing well but noted with increased scar tissue. Patient ambulates with a rolling walker, with step through gait pattern, decreased right step length, and decreased left stance time. Patient would benefit from skilled physical therapy to address deficits and address patient's goals.     Personal Factors and Comorbidities  Age;Fitness    Examination-Activity  Limitations  Bed Mobility    Stability/Clinical Decision Making  Stable/Uncomplicated    Clinical Decision Making  Low    Rehab Potential  Good    PT Frequency  2x /  week    PT Duration  4 weeks    PT Treatment/Interventions  ADLs/Self Care Home Management;Electrical Stimulation;Cryotherapy;Moist Heat;Gait training;Stair training;Manual techniques;Neuromuscular re-education;Balance training;Therapeutic exercise;Therapeutic activities;Patient/family education;Passive range of motion;Taping;Scar mobilization    PT Next Visit Plan  Nustep, hip abduction strengthening, gait training, modalities PRN for pain relief    PT Home Exercise Plan  see patient education section    Consulted and Agree with Plan of Care  Patient       Patient will benefit from skilled therapeutic intervention in order to improve the following deficits and impairments:  Difficulty walking, Decreased range of motion, Decreased strength, Pain, Decreased balance  Visit Diagnosis: Stiffness of left hip, not elsewhere classified - Plan: PT plan of care cert/re-cert  Pain in left hip - Plan: PT plan of care cert/re-cert  Difficulty in walking, not elsewhere classified - Plan: PT plan of care cert/re-cert     Problem List Patient Active Problem List   Diagnosis Date Noted  . S/P left THA, AA 08/27/2018   Guss Bunde, PT, DPT 09/17/2018, 10:06 PM  [facility] [address] [place], [place], [address] Phone: [phone]   Fax:  [phone]  Name: Suzanne Gibson MRN: [id number] Date of Birth: Jan 20, 1951

## 2018-09-19 ENCOUNTER — Ambulatory Visit: Payer: Medicare Other | Admitting: Physical Therapy

## 2018-09-19 DIAGNOSIS — M25552 Pain in left hip: Secondary | ICD-10-CM

## 2018-09-19 DIAGNOSIS — R262 Difficulty in walking, not elsewhere classified: Secondary | ICD-10-CM

## 2018-09-19 DIAGNOSIS — M25652 Stiffness of left hip, not elsewhere classified: Secondary | ICD-10-CM | POA: Diagnosis not present

## 2018-09-19 NOTE — Therapy (Signed)
[facility] [address] [place], [place], [address] Phone: [phone]   Fax:  [phone]  Physical Therapy Treatment  Patient Details  Name: Suzanne Gibson MRN: [id number] Date of Birth: 25-Aug-1950 Referring Provider (PT): Durene Romans, MD   Encounter Date: 09/19/2018  PT End of Session - 09/19/18 1410    Visit Number  2    Number of Visits  8    Date for PT Re-Evaluation  10/22/18    PT Start Time  1300    Activity Tolerance  Patient tolerated treatment well    Behavior During Therapy  [facility] for tasks assessed/performed       Past Medical History:  Diagnosis Date  . Hypothyroidism   . Pneumonia     Past Surgical History:  Procedure Laterality Date  . TONSILLECTOMY    . TOTAL HIP ARTHROPLASTY Left 08/27/2018   Procedure: TOTAL HIP ARTHROPLASTY ANTERIOR APPROACH;  Surgeon: Durene Romans, MD;  Location: [facility];  Service: Orthopedics;  Laterality: Left;  70 minutes    There were no vitals filed for this visit.  Subjective Assessment - 09/19/18 1313    Subjective  Patient states she ran a lot of errands yesterday and was more pain. She arrives ambulating with a walking stick.     Pertinent History  left total hip replacement 08/27/2018    Limitations  Sitting;House hold activities;Walking;Standing    Patient Stated Goals  improve strength, improve walking, return to recreational activities    Currently in Pain?  Yes    Pain Score  4     Pain Location  Hip    Pain Orientation  Left    Pain Descriptors / Indicators  Sore    Pain Onset  1 to 4 weeks ago    Pain Frequency  Intermittent         [facility] PT Assessment - 09/19/18 0001      Assessment   Medical Diagnosis  Presence of left artificial hip    Referring Provider (PT)  Durene Romans, MD    Onset Date/Surgical Date  08/27/18    Next MD Visit  March 19,2020    Prior Therapy  no      Precautions   Precautions  Anterior Hip      Restrictions   Weight Bearing  Restrictions  No                   [facility] Adult PT Treatment/Exercise - 09/19/18 0001      Ambulation/Gait   Ambulation Distance (Feet)  226 Feet    Assistive device  Other (Comment)   walking stick   Gait Pattern  Step-through pattern;Decreased step length - right;Decreased stance time - left;Decreased stride length;Decreased hip/knee flexion - left;Trendelenburg   trunk lean during left heel strike     Exercises   Exercises  Knee/Hip      Knee/Hip Exercises: Aerobic   Nustep  level 3 x15 mins       Knee/Hip Exercises: Supine   Heel Slides  AAROM;Left;10 reps    Bridges with [employer];Both    Straight Leg Raises  AAROM;2 sets;10 reps;Left    Other Supine Knee/Hip Exercises  Left clam Red x20 with 3" hold      Modalities   Modalities  Electrical Stimulation;Cryotherapy      Cryotherapy   Number Minutes Cryotherapy  15 Minutes    Cryotherapy Location  Hip    Type of Cryotherapy  Ice pack  Programme researcher, broadcasting/film/video Location  left hip    Electrical Stimulation Action  IFC    Electrical Stimulation Parameters  1-10 hz x15 mins    Electrical Stimulation Goals  Pain                  PT Long Term Goals - 09/17/18 2150      PT LONG TERM GOAL #1   Title  Patient will be independent with HEP    Time  4    Period  Weeks    Status  New      PT LONG TERM GOAL #2   Title  Patient will demonstrate 4/5 or greater left hip MMT to improve stability during functional tasks    Time  4    Period  Weeks    Status  New      PT LONG TERM GOAL #3   Title  Patient will improve LE functional strength and balance as noted by the ability to perform 5x sit to stand in 16 seconds or less.    Time  4    Period  Weeks    Status  New      PT LONG TERM GOAL #4   Title  Patinet will ambulate community distance with no AD and limited gait deviations.    Time  4    Period  Weeks    Status  New            Plan -  09/19/18 1313    Clinical Impression Statement  Patient was able to tolerate treatment well but noted with increased pain and fatigue as session continued. Patient noted with more lateral hip pain with nustep but did report a burning pain in left shin. Patient's walking stick was adjusted for her height and was provided with cuing for larger step length for equal step lengths as well as to reduced trunk extension. Patient was able to perform all exercises despite reports of muscle fatigue. Normal response to modalities upon removal..     Personal Factors and Comorbidities  Age;Fitness    Examination-Activity Limitations  Bed Mobility    Stability/Clinical Decision Making  Stable/Uncomplicated    Clinical Decision Making  Low    Rehab Potential  Good    PT Frequency  2x / week    PT Duration  4 weeks    PT Treatment/Interventions  ADLs/Self Care Home Management;Electrical Stimulation;Cryotherapy;Moist Heat;Gait training;Stair training;Manual techniques;Neuromuscular re-education;Balance training;Therapeutic exercise;Therapeutic activities;Patient/family education;Passive range of motion;Taping;Scar mobilization    PT Next Visit Plan  Nustep, hip abduction strengthening, gait training, modalities PRN for pain relief    Consulted and Agree with Plan of Care  Patient       Patient will benefit from skilled therapeutic intervention in order to improve the following deficits and impairments:  Difficulty walking, Decreased range of motion, Decreased strength, Pain, Decreased balance  Visit Diagnosis: Stiffness of left hip, not elsewhere classified  Pain in left hip  Difficulty in walking, not elsewhere classified     Problem List Patient Active Problem List   Diagnosis Date Noted  . 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 [address] [place], [place], [address] Phone: [phone]   Fax:   [phone]  Name: Suzanne Gibson MRN: [id number] Date of Birth: 11/27/1950

## 2018-09-24 ENCOUNTER — Ambulatory Visit: Payer: Medicare Other | Admitting: Physical Therapy

## 2018-09-24 ENCOUNTER — Encounter: Payer: Self-pay | Admitting: Physical Therapy

## 2018-09-24 DIAGNOSIS — M25652 Stiffness of left hip, not elsewhere classified: Secondary | ICD-10-CM

## 2018-09-24 DIAGNOSIS — M25552 Pain in left hip: Secondary | ICD-10-CM

## 2018-09-24 DIAGNOSIS — R262 Difficulty in walking, not elsewhere classified: Secondary | ICD-10-CM

## 2018-09-24 NOTE — Therapy (Signed)
[facility] [address] [place], [place], [address] Phone: [phone]   Fax:  [phone]  Physical Therapy Treatment  Patient Details  Name: Suzanne Gibson MRN: [id number] Date of Birth: 1951/02/19 Referring Provider (PT): Durene Romans, MD   Encounter Date: 09/24/2018  PT End of Session - 09/24/18 1312    Visit Number  3    Number of Visits  8    Date for PT Re-Evaluation  10/22/18    PT Start Time  1300    PT Stop Time  1400    PT Time Calculation (min)  60 min    Activity Tolerance  Patient tolerated treatment well    Behavior During Therapy  [facility] for tasks assessed/performed       Past Medical History:  Diagnosis Date  . Hypothyroidism   . Pneumonia     Past Surgical History:  Procedure Laterality Date  . TONSILLECTOMY    . TOTAL HIP ARTHROPLASTY Left 08/27/2018   Procedure: TOTAL HIP ARTHROPLASTY ANTERIOR APPROACH;  Surgeon: Durene Romans, MD;  Location: [facility];  Service: Orthopedics;  Laterality: Left;  70 minutes    There were no vitals filed for this visit.  Subjective Assessment - 09/24/18 1311    Subjective  Reports she went of a 30 minute walk in the park the other day and her glute is more sore today.     Pertinent History  left total hip replacement 08/27/2018    Limitations  Sitting;House hold activities;Walking;Standing    Patient Stated Goals  improve strength, improve walking, return to recreational activities    Currently in Pain?  Yes    Pain Score  5     Pain Location  Hip    Pain Orientation  Left    Pain Descriptors / Indicators  Sore    Pain Type  Surgical pain    Pain Onset  1 to 4 weeks ago         [facility] PT Assessment - 09/24/18 0001      Assessment   Medical Diagnosis  Presence of left artificial hip    Referring Provider (PT)  Durene Romans, MD    Onset Date/Surgical Date  08/27/18    Next MD Visit  March 19,2020    Prior Therapy  no      Precautions   Precautions  Anterior Hip      Restrictions   Weight Bearing Restrictions  No                   [facility] Adult PT Treatment/Exercise - 09/24/18 0001      Exercises   Exercises  Knee/Hip      Knee/Hip Exercises: Aerobic   Nustep  level 4 x15 mins       Knee/Hip Exercises: Standing   Hip Flexion  AROM;Left;2 sets;15 reps;Knee bent    Terminal Knee Extension  Strengthening;Left;2 sets;10 reps    Theraband Level (Terminal Knee Extension)  Level 2 (Red)    Hip Abduction  AROM;Both;2 sets;10 reps;Knee straight    Other Standing Knee Exercises  lateral stepping x3 minutes; red theraband      Knee/Hip Exercises: Supine   Straight Leg Raises  AAROM;2 sets;10 reps;Left      Modalities   Modalities  Electrical Stimulation;Cryotherapy      Cryotherapy   Number Minutes Cryotherapy  15 Minutes    Cryotherapy Location  Hip    Type of Cryotherapy  Ice pack  Manual Therapy   Manual Therapy  Passive ROM    Passive ROM  PROM into hip flexion, ER, and IR                   PT Long Term Goals - 09/17/18 2150      PT LONG TERM GOAL #1   Title  Patient will be independent with HEP    Time  4    Period  Weeks    Status  New      PT LONG TERM GOAL #2   Title  Patient will demonstrate 4/5 or greater left hip MMT to improve stability during functional tasks    Time  4    Period  Weeks    Status  New      PT LONG TERM GOAL #3   Title  Patient will improve LE functional strength and balance as noted by the ability to perform 5x sit to stand in 16 seconds or less.    Time  4    Period  Weeks    Status  New      PT LONG TERM GOAL #4   Title  Patinet will ambulate community distance with no AD and limited gait deviations.    Time  4    Period  Weeks    Status  New            Plan - 09/24/18 1422    Clinical Impression Statement  Patient was able to tolerate progression of treatment well with reports of muscle fatigue especialy in left glute. Patient noted with dynamic valgus with  standing marching and was instructed to maintain proper knee alignment. Patient noted with improved form after explanation. Patient noted with hip external rotation tightness during PROM which may be a contributing factor of dynamic valgus and hip IR during ambulations and activities. Patient noted with increased sensation to the more distal electrodes than proximal but noted with decreased sensation around incision area. Normal response to modalities upon removal.    Personal Factors and Comorbidities  Age;Fitness    Examination-Activity Limitations  Bed Mobility    Stability/Clinical Decision Making  Stable/Uncomplicated    Clinical Decision Making  Low    Rehab Potential  Good    PT Frequency  2x / week    PT Duration  4 weeks    PT Treatment/Interventions  ADLs/Self Care Home Management;Electrical Stimulation;Cryotherapy;Moist Heat;Gait training;Stair training;Manual techniques;Neuromuscular re-education;Balance training;Therapeutic exercise;Therapeutic activities;Patient/family education;Passive range of motion;Taping;Scar mobilization    PT Next Visit Plan  Nustep or bike, hip abduction strengthening, gait training, modalities PRN for pain relief    Consulted and Agree with Plan of Care  Patient       Patient will benefit from skilled therapeutic intervention in order to improve the following deficits and impairments:  Difficulty walking, Decreased range of motion, Decreased strength, Pain, Decreased balance  Visit Diagnosis: Stiffness of left hip, not elsewhere classified  Pain in left hip  Difficulty in walking, not elsewhere classified     Problem List Patient Active Problem List   Diagnosis Date Noted  . S/P left THA, AA 08/27/2018    Guss Bunde, PT, DPT 09/24/2018, 3:18 PM  [facility] [address] [place], [place], [address] Phone: [phone]   Fax:  [phone]  Name: Suzanne Gibson MRN: [id number] Date of  Birth: 23-May-1951

## 2018-09-26 ENCOUNTER — Other Ambulatory Visit: Payer: Self-pay

## 2018-09-26 ENCOUNTER — Encounter: Payer: Self-pay | Admitting: Physical Therapy

## 2018-09-26 ENCOUNTER — Ambulatory Visit: Payer: Medicare Other | Admitting: Physical Therapy

## 2018-09-26 DIAGNOSIS — M25652 Stiffness of left hip, not elsewhere classified: Secondary | ICD-10-CM | POA: Diagnosis not present

## 2018-09-26 DIAGNOSIS — R262 Difficulty in walking, not elsewhere classified: Secondary | ICD-10-CM

## 2018-09-26 DIAGNOSIS — M25552 Pain in left hip: Secondary | ICD-10-CM

## 2018-09-26 NOTE — Therapy (Signed)
[facility] [address] [place], [place], [address] Phone: [phone]   Fax:  [phone]  Physical Therapy Treatment  Patient Details  Name: Suzanne Gibson MRN: [id number] Date of Birth: 1950/11/29 Referring Provider (PT): Durene Romans, MD   Encounter Date: 09/26/2018  PT End of Session - 09/26/18 1307    Visit Number  4    Number of Visits  8    Date for PT Re-Evaluation  10/22/18    PT Start Time  1300    PT Stop Time  1359    PT Time Calculation (min)  59 min    Activity Tolerance  Patient tolerated treatment well    Behavior During Therapy  [facility] for tasks assessed/performed       Past Medical History:  Diagnosis Date  . Hypothyroidism   . Pneumonia     Past Surgical History:  Procedure Laterality Date  . TONSILLECTOMY    . TOTAL HIP ARTHROPLASTY Left 08/27/2018   Procedure: TOTAL HIP ARTHROPLASTY ANTERIOR APPROACH;  Surgeon: Durene Romans, MD;  Location: [facility];  Service: Orthopedics;  Laterality: Left;  70 minutes    There were no vitals filed for this visit.  Subjective Assessment - 09/26/18 1306    Subjective  Patient reports feeling achey in adductors today after last session and arrived without an AD    Pertinent History  left total hip replacement 08/27/2018    Limitations  Sitting;House hold activities;Walking;Standing    Patient Stated Goals  improve strength, improve walking, return to recreational activities    Currently in Pain?  Yes    Pain Score  5     Pain Location  Hip    Pain Orientation  Left    Pain Descriptors / Indicators  Sore;Aching    Pain Type  Surgical pain    Pain Onset  1 to 4 weeks ago         [facility] PT Assessment - 09/26/18 0001      Assessment   Medical Diagnosis  Presence of left artificial hip    Referring Provider (PT)  Durene Romans, MD    Onset Date/Surgical Date  08/27/18    Next MD Visit  March 19,2020    Prior Therapy  no      Precautions   Precautions  Anterior Hip                    [facility] Adult PT Treatment/Exercise - 09/26/18 0001      Exercises   Exercises  Knee/Hip      Knee/Hip Exercises: Aerobic   Nustep  level 5 x15 mins       Knee/Hip Exercises: Standing   Hip Abduction  AROM;Both;3 sets;10 reps;Knee straight    Forward Step Up  Left;2 sets;10 reps;Hand Hold: 2;Step Height: 6"    Step Down  Right;2 sets;10 reps;Hand Hold: 2;Step Height: 2";Step Height: 4"      Knee/Hip Exercises: Supine   Other Supine Knee/Hip Exercises  Left clam Red x20 with 3" hold      Knee/Hip Exercises: Sidelying   Clams  red clamshells x20 sidelying      Modalities   Modalities  Electrical Stimulation;Cryotherapy      Cryotherapy   Number Minutes Cryotherapy  15 Minutes    Cryotherapy Location  Hip   adductor   Type of Cryotherapy  Ice pack      Manual Therapy   Manual Therapy  Passive ROM    Passive  ROM  PROM into hip flexion, ER, and IR                   PT Long Term Goals - 09/17/18 2150      PT LONG TERM GOAL #1   Title  Patient will be independent with HEP    Time  4    Period  Weeks    Status  New      PT LONG TERM GOAL #2   Title  Patient will demonstrate 4/5 or greater left hip MMT to improve stability during functional tasks    Time  4    Period  Weeks    Status  New      PT LONG TERM GOAL #3   Title  Patient will improve LE functional strength and balance as noted by the ability to perform 5x sit to stand in 16 seconds or less.    Time  4    Period  Weeks    Status  New      PT LONG TERM GOAL #4   Title  Patinet will ambulate community distance with no AD and limited gait deviations.    Time  4    Period  Weeks    Status  New            Plan - 09/26/18 1415    Clinical Impression Statement  Patient was able to tolerate treatment well but is still having weakness to the left hip abductors. Patient noted with improved form after cuing for step ups. Patient unable to preform 4" step down without  dynamic valgus at this time. Patient inquired information from [place] Applegate, PT in regards to dry needling. Normal response upon removal of modalities.     Personal Factors and Comorbidities  Age;Fitness    Examination-Activity Limitations  Bed Mobility    Stability/Clinical Decision Making  Stable/Uncomplicated    Clinical Decision Making  Low    Rehab Potential  Good    PT Frequency  2x / week    PT Duration  4 weeks    PT Treatment/Interventions  ADLs/Self Care Home Management;Electrical Stimulation;Cryotherapy;Moist Heat;Gait training;Stair training;Manual techniques;Neuromuscular re-education;Balance training;Therapeutic exercise;Therapeutic activities;Patient/family education;Passive range of motion;Taping;Scar mobilization    PT Next Visit Plan  Nustep or bike, hip abduction strengthening, gait training, modalities PRN for pain relief    Consulted and Agree with Plan of Care  Patient       Patient will benefit from skilled therapeutic intervention in order to improve the following deficits and impairments:  Difficulty walking, Decreased range of motion, Decreased strength, Pain, Decreased balance  Visit Diagnosis: Stiffness of left hip, not elsewhere classified  Pain in left hip  Difficulty in walking, not elsewhere classified     Problem List Patient Active Problem List   Diagnosis Date Noted  . S/P left THA, AA 08/27/2018   Guss Bunde, PT, DPT 09/26/2018, 4:26 PM  [facility] [address] [place], [place], [address] Phone: [phone]   Fax:  [phone]  Name: Suzanne Gibson MRN: [id number] Date of Birth: Jun 28, 1951

## 2018-10-01 ENCOUNTER — Encounter: Payer: Self-pay | Admitting: Physical Therapy

## 2018-10-01 ENCOUNTER — Other Ambulatory Visit: Payer: Self-pay

## 2018-10-01 ENCOUNTER — Ambulatory Visit: Payer: Medicare Other | Admitting: Physical Therapy

## 2018-10-01 DIAGNOSIS — M25652 Stiffness of left hip, not elsewhere classified: Secondary | ICD-10-CM | POA: Diagnosis not present

## 2018-10-01 DIAGNOSIS — M25552 Pain in left hip: Secondary | ICD-10-CM

## 2018-10-01 DIAGNOSIS — R262 Difficulty in walking, not elsewhere classified: Secondary | ICD-10-CM

## 2018-10-01 NOTE — Therapy (Signed)
[facility] [address] [place], [place], [address] Phone: [phone]   Fax:  [phone]  Physical Therapy Treatment  Patient Details  Name: Suzanne Gibson MRN: [id number] Date of Birth: 1951/05/18 Referring Provider (PT): Durene Romans, MD   Encounter Date: 10/01/2018  PT End of Session - 10/01/18 1614    Visit Number  5    Number of Visits  8    Date for PT Re-Evaluation  10/22/18    PT Start Time  1300    PT Stop Time  1400    PT Time Calculation (min)  60 min    Activity Tolerance  Patient tolerated treatment well    Behavior During Therapy  [facility] for tasks assessed/performed       Past Medical History:  Diagnosis Date  . Hypothyroidism   . Pneumonia     Past Surgical History:  Procedure Laterality Date  . TONSILLECTOMY    . TOTAL HIP ARTHROPLASTY Left 08/27/2018   Procedure: TOTAL HIP ARTHROPLASTY ANTERIOR APPROACH;  Surgeon: Durene Romans, MD;  Location: [facility];  Service: Orthopedics;  Laterality: Left;  70 minutes    There were no vitals filed for this visit.  Subjective Assessment - 10/01/18 1611    Subjective  Reports feeling achy but minimal pain    Pertinent History  left total hip replacement 08/27/2018    Limitations  Sitting;House hold activities;Walking;Standing    Patient Stated Goals  improve strength, improve walking, return to recreational activities    Currently in Pain?  Yes    Pain Score  3     Pain Location  Hip    Pain Orientation  Left    Pain Descriptors / Indicators  Aching    Pain Type  Surgical pain    Pain Onset  1 to 4 weeks ago    Pain Frequency  Intermittent         [facility] PT Assessment - 10/01/18 0001      Assessment   Medical Diagnosis  Presence of left artificial hip    Referring Provider (PT)  Durene Romans, MD    Onset Date/Surgical Date  08/27/18    Next MD Visit  March 19,2020    Prior Therapy  no      Precautions   Precautions  Anterior Hip                    [facility] Adult PT Treatment/Exercise - 10/01/18 0001      Exercises   Exercises  Knee/Hip      Knee/Hip Exercises: Stretches   Piriformis Stretch  Left;1 rep;60 seconds      Knee/Hip Exercises: Aerobic   Stationary Bike  Level 2 x15 mins      Knee/Hip Exercises: Standing   Hip Flexion  Left;2 sets;15 reps;Knee bent;Stengthening    Hip Flexion Limitations  2#    Wall Squat  2 sets;10 reps;3 seconds    Other Standing Knee Exercises  lateral stepping x3 yellow theraband in tiled hall way      Modalities   Modalities  Electrical Stimulation;Cryotherapy      Cryotherapy   Number Minutes Cryotherapy  15 Minutes    Cryotherapy Location  Hip   adductor   Type of Cryotherapy  Ice pack             PT Education - 10/01/18 2034    Education Details  lateral stepping, lunges, figure 4 stretch    Person(s) Educated  Patient  Methods  Explanation;Demonstration;Handout    Comprehension  Verbalized understanding;Returned demonstration          PT Long Term Goals - 09/17/18 2150      PT LONG TERM GOAL #1   Title  Patient will be independent with HEP    Time  4    Period  Weeks    Status  New      PT LONG TERM GOAL #2   Title  Patient will demonstrate 4/5 or greater left hip MMT to improve stability during functional tasks    Time  4    Period  Weeks    Status  New      PT LONG TERM GOAL #3   Title  Patient will improve LE functional strength and balance as noted by the ability to perform 5x sit to stand in 16 seconds or less.    Time  4    Period  Weeks    Status  New      PT LONG TERM GOAL #4   Title  Patinet will ambulate community distance with no AD and limited gait deviations.    Time  4    Period  Weeks    Status  New            Plan - 10/01/18 1614    Clinical Impression Statement  Patient was able to tolerate progression of exercises well but still noted with left hip abduction weakness and control. Patient noted with  improved gait mechanics with equal step lengths but still with slow gait speed. Patient provided with updated HEP. Patient reported understanding. Normal response to modalities upon removal.     Personal Factors and Comorbidities  Age;Fitness    Examination-Activity Limitations  Bed Mobility    Stability/Clinical Decision Making  Stable/Uncomplicated    Clinical Decision Making  Low    Rehab Potential  Good    PT Frequency  2x / week    PT Duration  4 weeks    PT Treatment/Interventions  ADLs/Self Care Home Management;Electrical Stimulation;Cryotherapy;Moist Heat;Gait training;Stair training;Manual techniques;Neuromuscular re-education;Balance training;Therapeutic exercise;Therapeutic activities;Patient/family education;Passive range of motion;Taping;Scar mobilization    PT Next Visit Plan  Nustep or bike, hip abduction strengthening, gait training, modalities PRN for pain relief    PT Home Exercise Plan  see patient education section    Consulted and Agree with Plan of Care  Patient       Patient will benefit from skilled therapeutic intervention in order to improve the following deficits and impairments:  Difficulty walking, Decreased range of motion, Decreased strength, Pain, Decreased balance  Visit Diagnosis: Stiffness of left hip, not elsewhere classified  Pain in left hip  Difficulty in walking, not elsewhere classified     Problem List Patient Active Problem List   Diagnosis Date Noted  . S/P left THA, AA 08/27/2018   Guss Bunde, PT, DPT 10/01/2018, 8:41 PM  [facility] [address] [place], [place], [address] Phone: [phone]   Fax:  [phone]  Name: Suzanne Gibson MRN: [id number] Date of Birth: [birth year]

## 2018-10-03 ENCOUNTER — Other Ambulatory Visit: Payer: Self-pay

## 2018-10-03 ENCOUNTER — Encounter: Payer: Self-pay | Admitting: Physical Therapy

## 2018-10-03 ENCOUNTER — Ambulatory Visit: Payer: Medicare Other | Admitting: Physical Therapy

## 2018-10-03 DIAGNOSIS — M25652 Stiffness of left hip, not elsewhere classified: Secondary | ICD-10-CM | POA: Diagnosis not present

## 2018-10-03 DIAGNOSIS — R262 Difficulty in walking, not elsewhere classified: Secondary | ICD-10-CM

## 2018-10-03 DIAGNOSIS — M25552 Pain in left hip: Secondary | ICD-10-CM

## 2018-10-03 NOTE — Therapy (Addendum)
[facility] [place], [place], [address] Phone: [phone]   Fax:  [phone]  Physical Therapy Treatment PHYSICAL THERAPY DISCHARGE SUMMARY  Visits from Start of Care: 6  Current functional level related to goals / functional outcomes: See below   Remaining deficits: See goals   Education / Equipment: HEP Plan: Patient agrees to discharge.  Patient goals were partially met. Patient is being discharged due to being pleased with the current functional level.  ?????  Gabriela Eves, PT, DPT 07/14/19   Patient Details  Name: Suzanne Gibson MRN: [id number] Date of Birth: Jun 28, 1951 Referring Provider (PT): Paralee Cancel, MD   Encounter Date: 10/03/2018  PT End of Session - 10/03/18 1351    Visit Number  6    Number of Visits  8    Date for PT Re-Evaluation  10/22/18    PT Start Time  8841    PT Stop Time  1443    PT Time Calculation (min)  58 min    Activity Tolerance  Patient tolerated treatment well    Behavior During Therapy  [facility] for tasks assessed/performed       Past Medical History:  Diagnosis Date  . Hypothyroidism   . Pneumonia     Past Surgical History:  Procedure Laterality Date  . TONSILLECTOMY    . TOTAL HIP ARTHROPLASTY Left 08/27/2018   Procedure: TOTAL HIP ARTHROPLASTY ANTERIOR APPROACH;  Surgeon: Paralee Cancel, MD;  Location: [facility];  Service: Orthopedics;  Laterality: Left;  70 minutes    There were no vitals filed for this visit.  Subjective Assessment - 10/03/18 1349    Subjective  Reports feeling sore today in adductors and glute today but knows it is from working out. Patient reports going to the MD for a follow up tomorrow.     Pertinent History  left total hip replacement 08/27/2018    Limitations  Sitting;House hold activities;Walking;Standing    Patient Stated Goals  improve strength, improve walking, return to recreational activities    Currently in Pain?  Yes    Pain Score   3     Pain Location  Hip    Pain Orientation  Left    Pain Descriptors / Indicators  Aching    Pain Type  Surgical pain    Pain Onset  1 to 4 weeks ago    Pain Frequency  Intermittent         [facility] PT Assessment - 10/03/18 0001      Assessment   Medical Diagnosis  Presence of left artificial hip    Referring Provider (PT)  Paralee Cancel, MD    Onset Date/Surgical Date  08/27/18    Next MD Visit  March 19,2020    Prior Therapy  no      AROM   Left Hip Flexion  120    Left Hip External Rotation   30    Left Hip Internal Rotation   40    Left Hip ABduction  30      Strength   Left Hip Flexion  4-/5    Left Hip ABduction  4-/5    Left Knee Flexion  4+/5    Left Knee Extension  4+/5      Transfers   Transfers  Independent with all Transfers                   [facility] Adult PT Treatment/Exercise - 10/03/18 0001      Knee/Hip  Exercises: Aerobic   Nustep  Level 5 x10 LE only      Knee/Hip Exercises: Seated   Other Seated Knee/Hip Exercises  resisted hip internal and external rotation x20 each red    Marching  Strengthening;20 reps    Marching Limitations  Red Theraband    Sit to Sand  2 sets;10 reps;without UE support   red theraband     Modalities   Modalities  Electrical Stimulation;Cryotherapy      Cryotherapy   Number Minutes Cryotherapy  15 Minutes    Cryotherapy Location  Hip    Type of Cryotherapy  Ice pack      Electrical Stimulation   Electrical Stimulation Location  left hip    Electrical Stimulation Action  IFC    Electrical Stimulation Parameters  80-150 hz x15 mins    Electrical Stimulation Goals  Pain                  PT Long Term Goals - 10/03/18 1410      PT LONG TERM GOAL #1   Title  Patient will be independent with HEP    Time  4    Period  Weeks    Status  Achieved      PT LONG TERM GOAL #2   Title  Patient will demonstrate 4/5 or greater left hip MMT to improve stability during functional tasks    Time  4     Period  Weeks    Status  Partially Met      PT LONG TERM GOAL #3   Title  Patient will improve LE functional strength and balance as noted by the ability to perform 5x sit to stand in 16 seconds or less.    Time  4    Period  Weeks    Status  Achieved      PT LONG TERM GOAL #4   Title  Patinet will ambulate community distance with no AD and limited gait deviations.    Time  4    Period  Weeks    Status  Achieved            Plan - 10/03/18 1439    Clinical Impression Statement  Patient was able to tolerate treatment well with reports of hip soreness. Patient noted with improved form after demonstration and verbal cuing. Patient noted with the ability to perform SLR with minimal use of UEs indicating improved hip flexor and quad strength. Patient's goals have been met with exception of strength. Patient provided with HEP and theraband for advanced strengthening. Patient reported understanding.     Personal Factors and Comorbidities  Age;Fitness    Examination-Activity Limitations  Bed Mobility    Stability/Clinical Decision Making  Stable/Uncomplicated    Clinical Decision Making  Low    Rehab Potential  Good    PT Frequency  2x / week    PT Duration  4 weeks    PT Treatment/Interventions  ADLs/Self Care Home Management;Electrical Stimulation;Cryotherapy;Moist Heat;Gait training;Stair training;Manual techniques;Neuromuscular re-education;Balance training;Therapeutic exercise;Therapeutic activities;Patient/family education;Passive range of motion;Taping;Scar mobilization    PT Next Visit Plan  Nustep or bike, hip abduction strengthening, gait training, modalities PRN for pain relief    Consulted and Agree with Plan of Care  Patient       Patient will benefit from skilled therapeutic intervention in order to improve the following deficits and impairments:  Difficulty walking, Decreased range of motion, Decreased strength, Pain, Decreased balance  Visit Diagnosis: Stiffness of left  hip, not elsewhere classified  Pain in left hip  Difficulty in walking, not elsewhere classified     Problem List Patient Active Problem List   Diagnosis Date Noted  . 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

## 2018-10-07 ENCOUNTER — Ambulatory Visit: Payer: Medicare Other | Admitting: Physical Therapy

## 2018-10-08 ENCOUNTER — Encounter: Payer: 59 | Admitting: Physical Therapy

## 2018-10-10 ENCOUNTER — Encounter: Payer: 59 | Admitting: Physical Therapy

## 2019-07-21 IMAGING — DX DG PORTABLE PELVIS
1 series · 1 of 1 positions shown · non-contrast
Comparison: None.

CLINICAL DATA: Status post left total hip arthroplasty.

EXAM:
PORTABLE PELVIS 1-2 VIEWS

[pelvis ap]
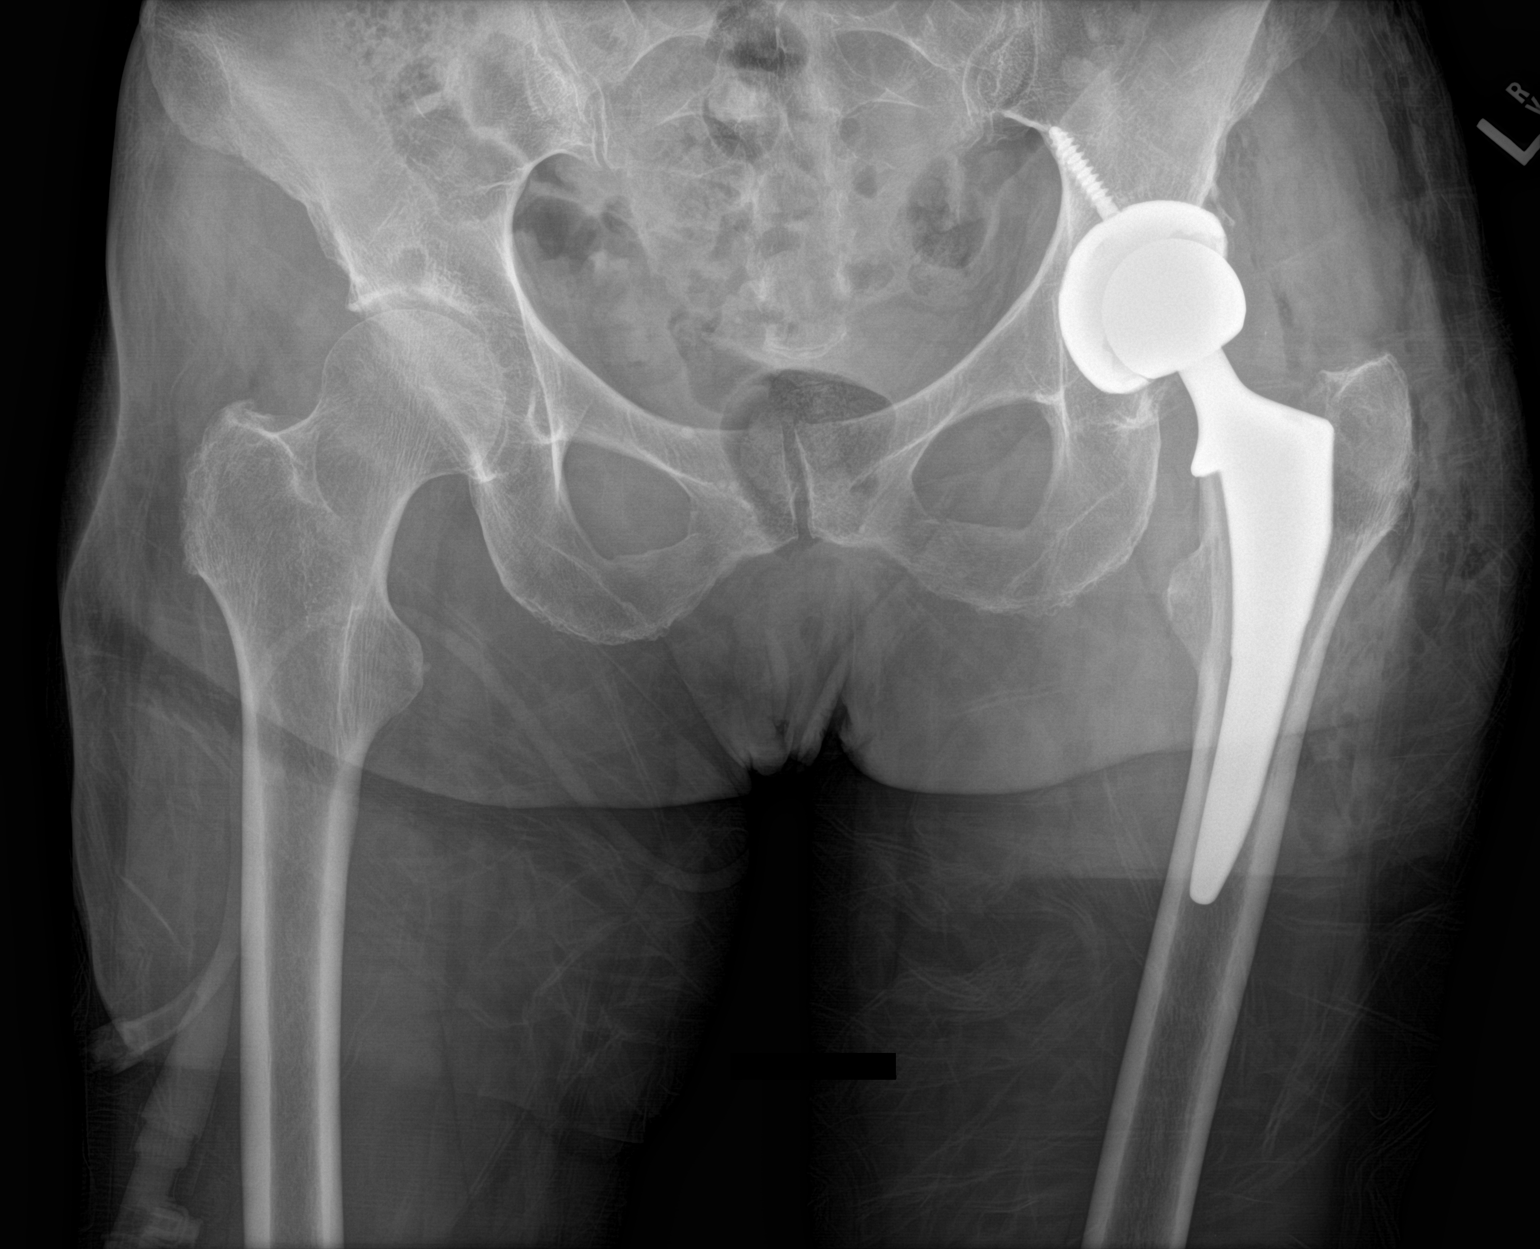

[1 of 1 positions shown; findings below may reference images not displayed]

FINDINGS: The acetabular and femoral components appear to be well situated.
Expected postoperative changes are seen in the surrounding soft
tissues. Right hip is unremarkable.
IMPRESSION: Status post left total hip arthroplasty.

## 2019-07-21 IMAGING — RF DG HIP (WITH PELVIS) OPERATIVE*L*
1 series · 2 of 2 positions shown · non-contrast
Comparison: None.

CLINICAL DATA: ANTERIOR approach LEFT total hip arthroplasty.

EXAM:
OPERATIVE LEFT HIP (WITH PELVIS IF PERFORMED) 1 VIEW
TECHNIQUE: Fluoroscopic spot image(s) were submitted for interpretation
post-operatively.

[Series 1: unknown protocol · 0.20mm/px · 2 of 2 slices shown]
[im 1/2]
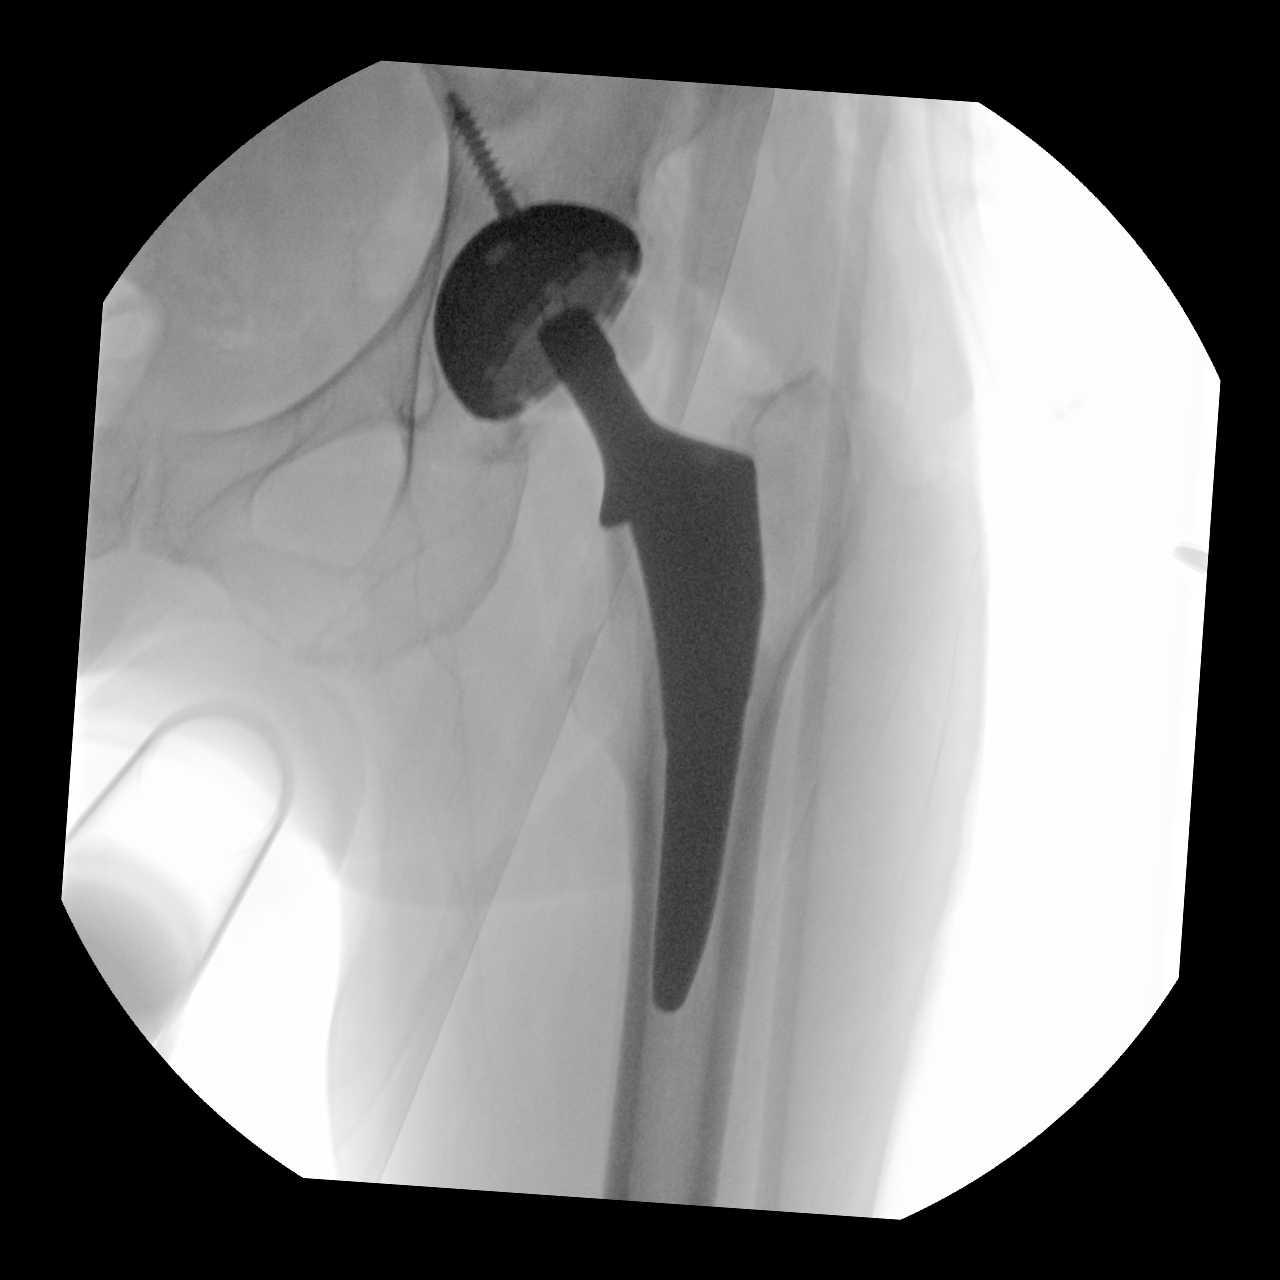
[im 2/2]
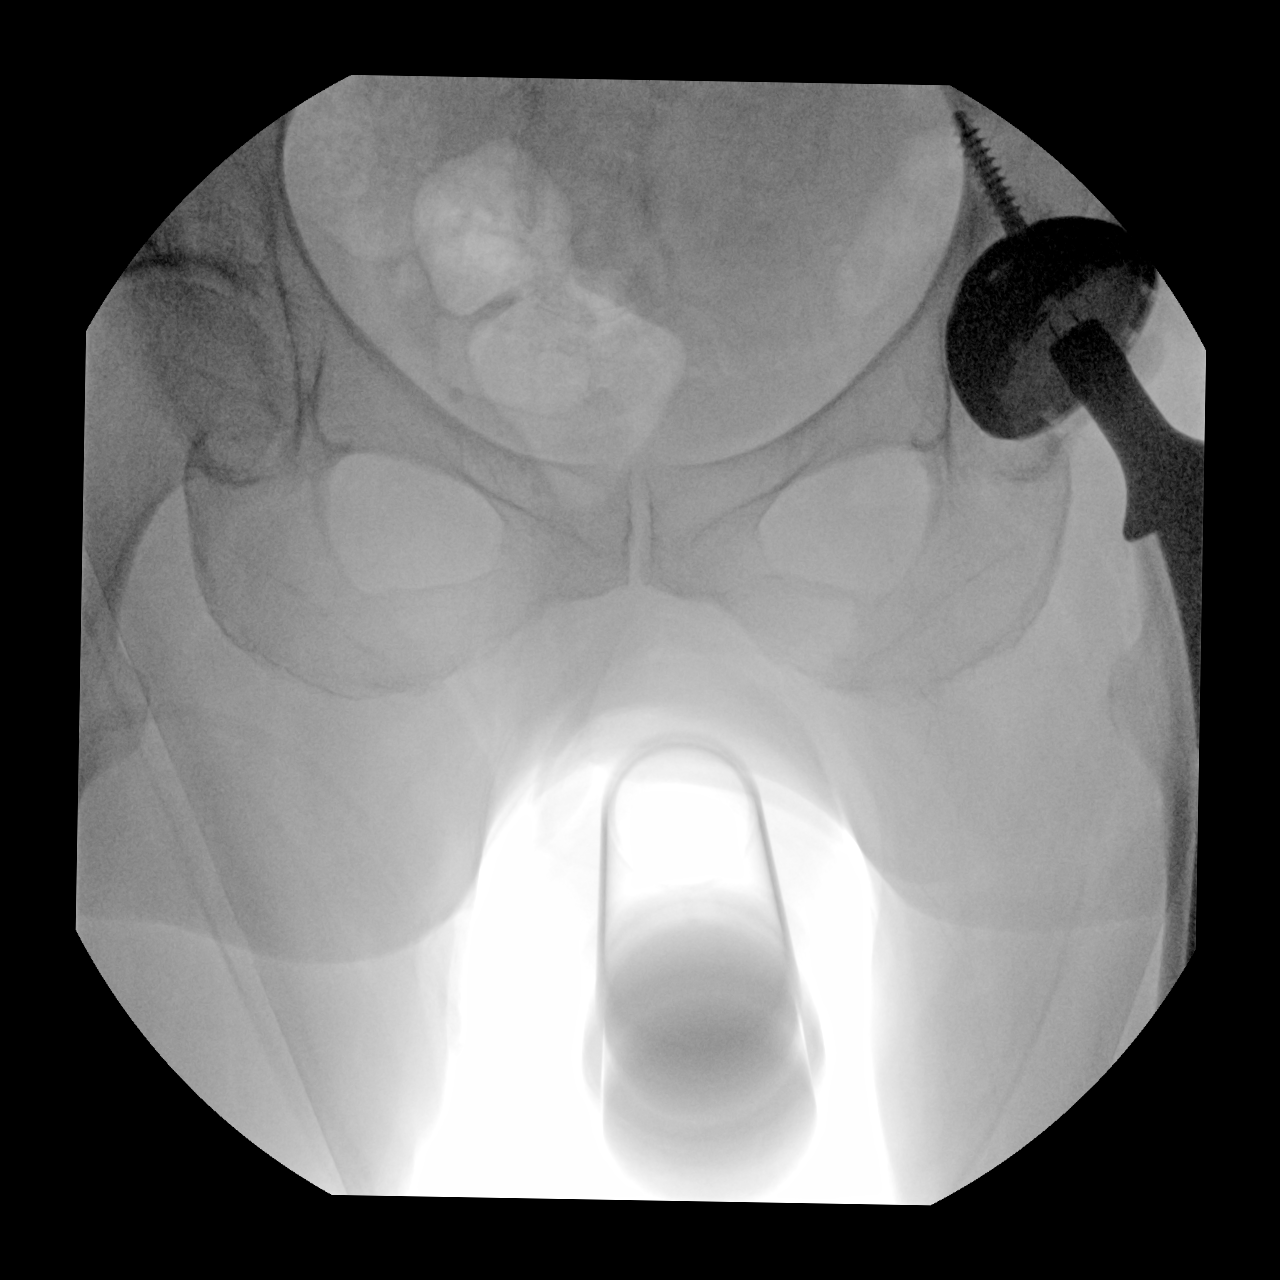

[2 of 2 positions shown; findings below may reference images not displayed]

FINDINGS: Anatomic alignment of the LEFT hip prosthesis in the AP projection
post LEFT total hip arthroplasty. The radiologic technologist
documented 13 seconds of fluoroscopy time with a patient dose of
1.37 mGy.
IMPRESSION: Anatomic alignment of the LEFT hip prosthesis in the AP projection
post LEFT total hip arthroplasty.

## 2022-11-17 ENCOUNTER — Other Ambulatory Visit (HOSPITAL_COMMUNITY): Payer: 59

## 2022-11-21 NOTE — Progress Notes (Signed)
COVID Vaccine received:  []  No [x]  Yes Date of any COVID positive Test in last 90 days:  PCP - Tedra Coupe, MD  at Internal Medicine - [facility] [place]  [phone] (Work) [phone] (Fax)  Clearance note in CE 11-17-2022 Cardiologist -   Chest x-ray - CT chest w/o contrast 09-18-2022    CE done in [place] EKG -  11-17-22 done in [place] at PCP. Will request faxed copy Stress Test -  ECHO -  Cardiac Cath -   PCR screen: [x]  Ordered & Completed           []   No Order but Needs PROFEND           []   N/A for this surgery  Surgery Plan:  []  Ambulatory                            [x]  Outpatient in bed                            []  Admit  Anesthesia:    []  General  [x]  Spinal                           []   Choice []   MAC  Pacemaker / ICD device [x]  No []  Yes   Spinal Cord Stimulator:[x]  No []  Yes       History of Sleep Apnea? [x]  No []  Yes   CPAP used?- [x]  No []  Yes    Does the patient monitor blood sugar?          []  No []  Yes  [x]  N/A  Patient has: [x]  NO Hx DM   []  Pre-DM                 []  DM1  []   DM2 Does patient have a [employer] or [employer]? []  No []  Yes   Fasting Blood Sugar Ranges-  Checks Blood Sugar _____ times a day  Blood Thinner / Instructions: none Aspirin Instructions:  none  ERAS Protocol Ordered: []  No  [x]  Yes PRE-SURGERY [x]  ENSURE  []  G2  Patient is to be NPO after: 05:30 am  Comments: Patient lives in [place]. Staying in [place] for surgery  Patient was given the 5 CHG shower / bath instructions for THA surgery along with 2 bottles of the CHG soap. Patient will start this on:  Friday  Nov 24, 2022.  Patient voiced understanding of this process.   Activity level: Patient is able / unable to climb a flight of stairs without difficulty; []  No CP  []  No SOB, but would have ___   Patient can / can not perform ADLs without assistance.   Anesthesia review: HTN  Patient denies shortness of breath, fever, cough and chest pain at PAT  appointment.  Patient verbalized understanding and agreement to the Pre-Surgical Instructions that were given to them at this PAT appointment. Patient was also educated of the need to review these PAT instructions again prior to her surgery.I reviewed the appropriate phone numbers to call if they have any and questions or concerns.

## 2022-11-21 NOTE — Patient Instructions (Signed)
SURGICAL WAITING ROOM VISITATION Patients having surgery or a procedure may have no more than 2 support people in the waiting area - these visitors may rotate in the visitor waiting room.   Due to an increase in RSV and influenza rates and associated hospitalizations, children ages 82 and under may not visit patients in [facility] hospitals. If the patient needs to stay at the hospital during part of their recovery, the visitor guidelines for inpatient rooms apply.  PRE-OP VISITATION  Pre-op nurse will coordinate an appropriate time for 1 support person to accompany the patient in pre-op.  This support person may not rotate.  This visitor will be contacted when the time is appropriate for the visitor to come back in the pre-op area.  Please refer to the [facility] website for the visitor guidelines for Inpatients (after your surgery is over and you are in a regular room).  You are not required to quarantine at this time prior to your surgery. However, you must do this: Hand Hygiene often Do NOT share personal items Notify your provider if you are in close contact with someone who has COVID or you develop fever 100.4 or greater, new onset of sneezing, cough, sore throat, shortness of breath or body aches.  If you test positive for Covid or have been in contact with anyone that has tested positive in the last 10 days please notify you surgeon.    Your procedure is scheduled on:  Tuesday  Nov 28, 2022  Report to [facility] Main Entrance: [facility]ota [facility]sen entrance where the [employer] is available.   Report to admitting at: 06:00  AM  +++++Call this number if you have any questions or problems the morning of surgery [phone]  Do not eat food after Midnight the night prior to your surgery/procedure.  After Midnight you may have the following liquids until  05:30  AM DAY OF SURGERY  Clear Liquid Diet Water Black Coffee (sugar ok, NO MILK/CREAM OR CREAMERS)  Tea (sugar ok, NO  MILK/CREAM OR CREAMERS) regular and decaf                             Plain Jell-O  with no fruit (NO RED)                                           Fruit ices (not with fruit pulp, NO RED)                                     Popsicles (NO RED)                                                                  Juice: apple, WHITE grape, WHITE cranberry Sports drinks like Gatorade or Powerade (NO RED)                    The day of surgery:  Drink ONE (1) Pre-Surgery Clear Ensure at  05:30 AM the morning of surgery. Drink in one sitting. Do  not sip.  This drink was given to you during your hospital pre-op appointment visit. Nothing else to drink after completing the Pre-Surgery Clear Ensure : No candy, chewing gum or throat lozenges.    FOLLOW  ANY ADDITIONAL PRE OP INSTRUCTIONS YOU RECEIVED FROM YOUR SURGEON'S OFFICE!!!   Oral Hygiene is also important to reduce your risk of infection.        Remember - BRUSH YOUR TEETH THE MORNING OF SURGERY WITH YOUR REGULAR TOOTHPASTE  Do NOT smoke after Midnight the night before surgery.  Take ONLY these medicines the morning of surgery with A SIP OF WATER: Thyroid (Armour).                     You may not have any metal on your body including hair pins, jewelry, and body piercing  Do not wear make-up, lotions, powders, perfumes or deodorant  Do not wear nail polish including gel and S&S, artificial / acrylic nails, or any other type of covering on natural nails including finger and toenails. If you have artificial nails, gel coating, etc., that needs to be removed by a nail salon, Please have this removed prior to surgery. Not doing so may mean that your surgery could be cancelled or delayed if the Surgeon or anesthesia staff feels like they are unable to monitor you safely.   Do not shave 48 hours prior to surgery to avoid nicks in your skin which may contribute to postoperative infections.   Contacts, Hearing Aids, dentures or bridgework may  not be worn into surgery. DENTURES WILL BE REMOVED PRIOR TO SURGERY PLEASE DO NOT APPLY "Poly grip" OR ADHESIVES!!!  You may bring a small overnight bag with you on the day of surgery, only pack items that are not valuable. [facility] IS NOT RESPONSIBLE   FOR VALUABLES THAT ARE LOST OR STOLEN.   Do not bring your home medications to the hospital. The Pharmacy will dispense medications listed on your medication list to you during your admission in the Hospital.  Special Instructions: Bring a copy of your healthcare power of attorney and living will documents the day of surgery, if you wish to have them scanned into your [facility] Records- EPIC  Please read over the following fact sheets you were given: IF YOU HAVE QUESTIONS ABOUT YOUR PRE-OP INSTRUCTIONS, PLEASE CALL [phone].   +++++++ PLEASE FOLLOW THE ATTACHED INFORMATION REGARDING SHOWERING / BATHING SCHEDULE  PRIOR TO YOUR SURGERY. Start this schedule on :  Friday  Nov 24, 2022    ON THE DAY OF SURGERY : Do not apply any lotions/deodorants the morning of surgery.  Please wear clean clothes to the hospital/surgery center.    FAILURE TO FOLLOW THESE INSTRUCTIONS MAY RESULT IN THE CANCELLATION OF YOUR SURGERY  PATIENT SIGNATURE_________________________________  NURSE SIGNATURE__________________________________  ________________________________________________________________________        Suzanne Gibson    An incentive spirometer is a tool that can help keep your lungs clear and active. This tool measures how well you are filling your lungs with each breath. Taking long deep breaths may help reverse or decrease the chance of developing breathing (pulmonary) problems (especially infection) following: A long period of time when you are unable to move or be active. BEFORE THE PROCEDURE  If the spirometer includes an indicator to show your best effort, your nurse or respiratory therapist will set it to a  desired goal. If possible, sit up straight or lean slightly forward. Try not to slouch. Hold  the incentive spirometer in an upright position. INSTRUCTIONS FOR USE  Sit on the edge of your bed if possible, or sit up as far as you can in bed or on a chair. Hold the incentive spirometer in an upright position. Breathe out normally. Place the mouthpiece in your mouth and seal your lips tightly around it. Breathe in slowly and as deeply as possible, raising the piston or the ball toward the top of the column. Hold your breath for 3-5 seconds or for as long as possible. Allow the piston or ball to fall to the bottom of the column. Remove the mouthpiece from your mouth and breathe out normally. Rest for a few seconds and repeat Steps 1 through 7 at least 10 times every 1-2 hours when you are awake. Take your time and take a few normal breaths between deep breaths. The spirometer may include an indicator to show your best effort. Use the indicator as a goal to work toward during each repetition. After each set of 10 deep breaths, practice coughing to be sure your lungs are clear. If you have an incision (the cut made at the time of surgery), support your incision when coughing by placing a pillow or rolled up towels firmly against it. Once you are able to get out of bed, walk around indoors and cough well. You may stop using the incentive spirometer when instructed by your caregiver.  RISKS AND COMPLICATIONS Take your time so you do not get dizzy or light-headed. If you are in pain, you may need to take or ask for pain medication before doing incentive spirometry. It is harder to take a deep breath if you are having pain. AFTER USE Rest and breathe slowly and easily. It can be helpful to keep track of a log of your progress. Your caregiver can provide you with a simple table to help with this. If you are using the spirometer at home, follow these instructions: SEEK MEDICAL CARE IF:  You are having  difficultly using the spirometer. You have trouble using the spirometer as often as instructed. Your pain medication is not giving enough relief while using the spirometer. You develop fever of 100.5 F (38.1 C) or higher.                                                                                                    SEEK IMMEDIATE MEDICAL CARE IF:  You cough up bloody sputum that had not been present before. You develop fever of 102 F (38.9 C) or greater. You develop worsening pain at or near the incision site. MAKE SURE YOU:  Understand these instructions. Will watch your condition. Will get help right away if you are not doing well or get worse. Document Released: 11/13/2006 Document Revised: 09/25/2011 Document Reviewed: 01/14/2007 [facility] Patient Information 2014 [place], [place].       WHAT IS A BLOOD TRANSFUSION? Blood Transfusion Information  A transfusion is the replacement of blood or some of its parts. Blood is made up of multiple cells which provide different functions. Red blood cells carry oxygen and are  used for blood loss replacement. White blood cells fight against infection. Platelets control bleeding. Plasma helps clot blood. Other blood products are available for specialized needs, such as hemophilia or other clotting disorders. BEFORE THE TRANSFUSION  Who gives blood for transfusions?  Healthy volunteers who are fully evaluated to make sure their blood is safe. This is blood bank blood. Transfusion therapy is the safest it has ever been in the practice of medicine. Before blood is taken from a donor, a complete history is taken to make sure that person has no history of diseases nor engages in risky social behavior (examples are intravenous drug use or sexual activity with multiple partners). The donor's travel history is screened to minimize risk of transmitting infections, such as malaria. The donated blood is tested for signs of infectious diseases, such  as HIV and hepatitis. The blood is then tested to be sure it is compatible with you in order to minimize the chance of a transfusion reaction. If you or a relative donates blood, this is often done in anticipation of surgery and is not appropriate for emergency situations. It takes many days to process the donated blood. RISKS AND COMPLICATIONS Although transfusion therapy is very safe and saves many lives, the main dangers of transfusion include:  Getting an infectious disease. Developing a transfusion reaction. This is an allergic reaction to something in the blood you were given. Every precaution is taken to prevent this. The decision to have a blood transfusion has been considered carefully by your caregiver before blood is given. Blood is not given unless the benefits outweigh the risks. AFTER THE TRANSFUSION Right after receiving a blood transfusion, you will usually feel much better and more energetic. This is especially true if your red blood cells have gotten low (anemic). The transfusion raises the level of the red blood cells which carry oxygen, and this usually causes an energy increase. The nurse administering the transfusion will monitor you carefully for complications. HOME CARE INSTRUCTIONS  No special instructions are needed after a transfusion. You may find your energy is better. Speak with your caregiver about any limitations on activity for underlying diseases you may have. SEEK MEDICAL CARE IF:  Your condition is not improving after your transfusion. You develop redness or irritation at the intravenous (IV) site. SEEK IMMEDIATE MEDICAL CARE IF:  Any of the following symptoms occur over the next 12 hours: Shaking chills. You have a temperature by mouth above 102 F (38.9 C), not controlled by medicine. Chest, back, or muscle pain. People around you feel you are not acting correctly or are confused. Shortness of breath or difficulty breathing. Dizziness and fainting. You get  a rash or develop hives. You have a decrease in urine output. Your urine turns a dark color or changes to pink, red, or brown. Any of the following symptoms occur over the next 10 days: You have a temperature by mouth above 102 F (38.9 C), not controlled by medicine. Shortness of breath. Weakness after normal activity. The white part of the eye turns yellow (jaundice). You have a decrease in the amount of urine or are urinating less often. Your urine turns a dark color or changes to pink, red, or brown. Document Released: 06/30/2000 Document Revised: 09/25/2011 Document Reviewed: 02/17/2008 [facility] Patient Information 2014 [place], [place].  _______________________________________________________________________

## 2022-11-22 ENCOUNTER — Encounter (HOSPITAL_COMMUNITY): Payer: Self-pay

## 2022-11-22 ENCOUNTER — Encounter (HOSPITAL_COMMUNITY)
Admission: RE | Admit: 2022-11-22 | Discharge: 2022-11-22 | Disposition: A | Discharge status: Home / Self Care | Payer: Medicare Other | Source: Ambulatory Visit | Attending: Orthopedic Surgery | Admitting: Orthopedic Surgery

## 2022-11-22 ENCOUNTER — Other Ambulatory Visit: Payer: Self-pay

## 2022-11-22 VITALS — BP 130/74 | HR 64 | Temp 98.0°F | Resp 16 | Ht 66.5 in | Wt 138.0 lb

## 2022-11-22 DIAGNOSIS — Z01818 Encounter for other preprocedural examination: Secondary | ICD-10-CM | POA: Diagnosis present

## 2022-11-22 DIAGNOSIS — M1611 Unilateral primary osteoarthritis, right hip: Secondary | ICD-10-CM | POA: Insufficient documentation

## 2022-11-22 DIAGNOSIS — I10 Essential (primary) hypertension: Secondary | ICD-10-CM | POA: Diagnosis not present

## 2022-11-22 HISTORY — DX: Unspecified osteoarthritis, unspecified site: M19.90

## 2022-11-22 LAB — TYPE AND SCREEN: ABO/RH(D): O POS

## 2022-11-22 LAB — SURGICAL PCR SCREEN
MRSA, PCR: NEGATIVE
Staphylococcus aureus: NEGATIVE

## 2022-11-27 NOTE — H&P (Signed)
TOTAL HIP ADMISSION H&P  Patient is admitted for right total hip arthroplasty.  Subjective:  Chief Complaint: right hip pain  HPI: Suzanne Gibson, 72 y.o. female, has a history of pain and functional disability in the right hip(s) due to arthritis and patient has failed non-surgical conservative treatments for greater than 12 weeks to include NSAID's and/or analgesics and activity modification.  Onset of symptoms was gradual starting 2 years ago with gradually worsening course since that time.The patient noted no past surgery on the right hip(s).  Patient currently rates pain in the right hip at 8 out of 10 with activity. Patient has worsening of pain with activity and weight bearing, pain that interfers with activities of daily living, and pain with passive range of motion. Patient has evidence of joint space narrowing by imaging studies. This condition presents safety issues increasing the risk of falls. There is no current active infection.  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   Current Outpatient Medications  Medication Sig Dispense Refill Last Dose   Multiple Vitamin (MULTIVITAMIN) tablet Take 1 tablet by mouth daily.      thyroid (ARMOUR) 90 MG tablet Take 90 mg by mouth See admin instructions.      acetaminophen (TYLENOL) 500 MG tablet Take 2 tablets (1,000 mg total) by mouth every 8 (eight) hours. (Patient not taking: Reported on 11/21/2022) 30 tablet 0 Not Taking    diazepam (VALIUM) 5 MG tablet Take 2.5 mg by mouth as needed for anxiety.      docusate sodium (COLACE) 100 MG capsule Take 1 capsule (100 mg total) by mouth 2 (two) times daily. (Patient not taking: Reported on 11/21/2022) 10 capsule 0 Not Taking   ferrous sulfate (FERROUSUL) 325 (65 FE) MG tablet Take 1 tablet (325 mg total) by mouth 3 (three) times daily with meals. (Patient not taking: Reported on 11/21/2022)  3 Not Taking   methocarbamol (ROBAXIN) 500 MG tablet Take 1 tablet (500 mg total) by mouth every 6 (six) hours as needed for muscle spasms. (Patient not taking: Reported on 11/21/2022) 40 tablet 0 Not Taking   polyethylene glycol (MIRALAX / GLYCOLAX) packet Take 17 g by mouth 2 (two) times daily. (Patient not taking: Reported on 11/21/2022) 14 each 0 Not Taking   Allergies  Allergen Reactions   Augmentin [Amoxicillin-Pot Clavulanate] Rash   Levofloxacin Rash    Headache    Social History   Tobacco Use   Smoking status: Never   Smokeless tobacco: Never  Substance Use Topics   Alcohol use: Yes    Alcohol/week: 2.0 standard drinks of alcohol    Types: 2 Glasses of wine per week    Comment: occasional    No family history on file.   Review of Systems  Constitutional:  Negative for chills and fever.  Respiratory:  Negative for cough and shortness of breath.   Cardiovascular:  Negative for chest pain.  Gastrointestinal:  Negative for nausea and vomiting.  Musculoskeletal:  Positive for arthralgias.     Objective:  Physical Exam Well nourished and well developed. General: Alert and oriented x3, cooperative and pleasant, no acute distress. Head: normocephalic, atraumatic, neck supple. Eyes: EOMI.  Musculoskeletal: Left hip exam: History of arthroplasty with fluid range of motion from 30 degrees of internal to over 40 degrees of external rotation without pain Active hip flexion with 5/5 strength over 130 degrees  Right hip exam: She does have reproducible pain with hip  flexion internal rotation of 20 degrees, external rotation to 30 degrees Mild tenderness No significant external rotation contracture with active hip flexion   Calves soft and nontender. Motor function intact in LE. Strength 5/5 LE bilaterally. Neuro: Distal pulses 2+. Sensation to light touch intact in LE.  Vital signs in last 24 hours:    Labs:   Estimated body mass index is 21.94 kg/m as calculated from the following:   Height as of 11/22/22: 5' 6.5" (1.689 m).   Weight as of 11/22/22: 62.6 kg.   Imaging Review Plain radiographs demonstrate severe degenerative joint disease of the right hip(s). The bone quality appears to be adequate for age and reported activity level.      Assessment/Plan:  End stage arthritis, right hip(s)  The patient history, physical examination, clinical judgement of the provider and imaging studies are consistent with end stage degenerative joint disease of the right hip(s) and total hip arthroplasty is deemed medically necessary. The treatment options including medical management, injection therapy, arthroscopy and arthroplasty were discussed at length. The risks and benefits of total hip arthroplasty were presented and reviewed. The risks due to aseptic loosening, infection, stiffness, dislocation/subluxation,  thromboembolic complications and other imponderables were discussed.  The patient acknowledged the explanation, agreed to proceed with the plan and consent was signed. Patient is being admitted for inpatient treatment for surgery, pain control, PT, OT, prophylactic antibiotics, VTE prophylaxis, progressive ambulation and ADL's and discharge planning.The patient is planning to be discharged  home.   Therapy Plans: HHPT Disposition: Home with boyfriend Lequita Halt (niece helping as well) Planned DVT Prophylaxis: aspirin 81mg  BID DME needed: walker PCP: Dr. Durene Cal, will reach out today - refaxed form TXA: IV Allergies: Augmentin - hives, levaquin -  rash Anesthesia Concerns: none BMI: 22.3 Last HgbA1c: Not diabetic   Other: - patient would like to do OPPT - tylenol, celebrex, robaxin, oxycodone  Rosalene Billings, PA-C Orthopedic Surgery [facility] [place] [phone]

## 2022-11-28 ENCOUNTER — Ambulatory Visit (HOSPITAL_COMMUNITY): Payer: Medicare Other

## 2022-11-28 ENCOUNTER — Ambulatory Visit (HOSPITAL_BASED_OUTPATIENT_CLINIC_OR_DEPARTMENT_OTHER): Payer: Medicare Other | Admitting: Certified Registered"

## 2022-11-28 ENCOUNTER — Observation Stay (HOSPITAL_COMMUNITY)
Admission: RE | Admit: 2022-11-28 | Discharge: 2022-11-29 | Disposition: A | Discharge status: Home / Self Care | Payer: Medicare Other | Source: Ambulatory Visit | Attending: Orthopedic Surgery | Admitting: Orthopedic Surgery

## 2022-11-28 ENCOUNTER — Ambulatory Visit (HOSPITAL_COMMUNITY): Payer: Medicare Other | Admitting: Certified Registered"

## 2022-11-28 ENCOUNTER — Encounter (HOSPITAL_COMMUNITY): Payer: Self-pay | Admitting: Orthopedic Surgery

## 2022-11-28 ENCOUNTER — Other Ambulatory Visit: Payer: Self-pay

## 2022-11-28 ENCOUNTER — Encounter (HOSPITAL_COMMUNITY)
Admission: RE | Disposition: A | Discharge status: Home / Self Care | Payer: Self-pay | Source: Ambulatory Visit | Attending: Orthopedic Surgery

## 2022-11-28 DIAGNOSIS — Z01818 Encounter for other preprocedural examination: Secondary | ICD-10-CM

## 2022-11-28 DIAGNOSIS — I10 Essential (primary) hypertension: Secondary | ICD-10-CM

## 2022-11-28 DIAGNOSIS — Z96642 Presence of left artificial hip joint: Secondary | ICD-10-CM | POA: Diagnosis not present

## 2022-11-28 DIAGNOSIS — Z79899 Other long term (current) drug therapy: Secondary | ICD-10-CM | POA: Insufficient documentation

## 2022-11-28 DIAGNOSIS — M1611 Unilateral primary osteoarthritis, right hip: Secondary | ICD-10-CM

## 2022-11-28 DIAGNOSIS — E039 Hypothyroidism, unspecified: Secondary | ICD-10-CM | POA: Insufficient documentation

## 2022-11-28 DIAGNOSIS — Z96641 Presence of right artificial hip joint: Secondary | ICD-10-CM

## 2022-11-28 HISTORY — PX: TOTAL HIP ARTHROPLASTY: SHX124

## 2022-11-28 LAB — TYPE AND SCREEN: Antibody Screen: NEGATIVE

## 2022-11-28 SURGERY — ARTHROPLASTY, HIP, TOTAL, ANTERIOR APPROACH
Anesthesia: Monitor Anesthesia Care | Site: Hip | Laterality: Right

## 2022-11-28 MED ORDER — BUPIVACAINE-EPINEPHRINE (PF) 0.25% -1:200000 IJ SOLN
INTRAMUSCULAR | Status: DC | PRN
  Administered 2022-11-28: 30 mL

## 2022-11-28 MED ORDER — LACTATED RINGERS IV SOLN: INTRAVENOUS | Status: DC

## 2022-11-28 MED ORDER — METHOCARBAMOL 500 MG IVPB - SIMPLE MED
INTRAVENOUS | Status: AC
  Filled 2022-11-28: qty 55

## 2022-11-28 MED ORDER — METOCLOPRAMIDE HCL 5 MG PO TABS: 5.0000 mg | ORAL_TABLET | Freq: Three times a day (TID) | ORAL | Status: DC | PRN

## 2022-11-28 MED ORDER — BUPIVACAINE IN DEXTROSE 0.75-8.25 % IT SOLN
INTRATHECAL | Status: DC | PRN
  Administered 2022-11-28: 1.6 mL via INTRATHECAL

## 2022-11-28 MED ORDER — DEXAMETHASONE SODIUM PHOSPHATE 10 MG/ML IJ SOLN
10.0000 mg | Freq: Once | INTRAMUSCULAR | Status: DC
  Filled 2022-11-28: qty 1

## 2022-11-28 MED ORDER — FENTANYL CITRATE (PF) 100 MCG/2ML IJ SOLN
INTRAMUSCULAR | Status: DC | PRN
  Administered 2022-11-28 (×2): 25 ug via INTRAVENOUS
  Administered 2022-11-28: 50 ug via INTRAVENOUS

## 2022-11-28 MED ORDER — DOCUSATE SODIUM 100 MG PO CAPS
100.0000 mg | ORAL_CAPSULE | Freq: Two times a day (BID) | ORAL | Status: DC
  Administered 2022-11-28: 100 mg via ORAL
  Filled 2022-11-28 (×2): qty 1

## 2022-11-28 MED ORDER — PROPOFOL 1000 MG/100ML IV EMUL
INTRAVENOUS | Status: AC
  Filled 2022-11-28: qty 100

## 2022-11-28 MED ORDER — SODIUM CHLORIDE 0.9 % IV SOLN: INTRAVENOUS | Status: DC

## 2022-11-28 MED ORDER — THYROID 60 MG PO TABS
90.0000 mg | ORAL_TABLET | Freq: Every day | ORAL | Status: DC
  Filled 2022-11-28: qty 1

## 2022-11-28 MED ORDER — METHOCARBAMOL 500 MG IVPB - SIMPLE MED
500.0000 mg | Freq: Four times a day (QID) | INTRAVENOUS | Status: DC | PRN
  Administered 2022-11-28: 500 mg via INTRAVENOUS

## 2022-11-28 MED ORDER — OXYCODONE HCL 5 MG PO TABS: 5.0000 mg | ORAL_TABLET | ORAL | Status: DC | PRN

## 2022-11-28 MED ORDER — ACETAMINOPHEN 325 MG PO TABS: 325.0000 mg | ORAL_TABLET | Freq: Four times a day (QID) | ORAL | Status: DC | PRN

## 2022-11-28 MED ORDER — ACETAMINOPHEN 160 MG/5ML PO SOLN: 1000.0000 mg | Freq: Once | ORAL | Status: DC | PRN

## 2022-11-28 MED ORDER — ORAL CARE MOUTH RINSE: 15.0000 mL | Freq: Once | OROMUCOSAL | Status: AC

## 2022-11-28 MED ORDER — BISACODYL 10 MG RE SUPP: 10.0000 mg | Freq: Every day | RECTAL | Status: DC | PRN

## 2022-11-28 MED ORDER — MIDAZOLAM HCL 5 MG/5ML IJ SOLN
INTRAMUSCULAR | Status: DC | PRN
  Administered 2022-11-28: 2 mg via INTRAVENOUS

## 2022-11-28 MED ORDER — ONDANSETRON HCL 4 MG/2ML IJ SOLN
INTRAMUSCULAR | Status: AC
  Filled 2022-11-28: qty 2

## 2022-11-28 MED ORDER — SODIUM CHLORIDE (PF) 0.9 % IJ SOLN
INTRAMUSCULAR | Status: DC | PRN
  Administered 2022-11-28: 30 mL

## 2022-11-28 MED ORDER — PROPOFOL 10 MG/ML IV BOLUS
INTRAVENOUS | Status: DC | PRN
  Administered 2022-11-28: 30 mg via INTRAVENOUS
  Administered 2022-11-28: 75 ug/kg/min via INTRAVENOUS

## 2022-11-28 MED ORDER — DIAZEPAM 5 MG PO TABS: 2.5000 mg | ORAL_TABLET | ORAL | Status: DC | PRN

## 2022-11-28 MED ORDER — STERILE WATER FOR IRRIGATION IR SOLN
Status: DC | PRN
  Administered 2022-11-28: 2000 mL

## 2022-11-28 MED ORDER — DEXAMETHASONE SODIUM PHOSPHATE 10 MG/ML IJ SOLN
8.0000 mg | Freq: Once | INTRAMUSCULAR | Status: AC
  Administered 2022-11-28: 8 mg via INTRAVENOUS

## 2022-11-28 MED ORDER — ONDANSETRON HCL 4 MG/2ML IJ SOLN: 4.0000 mg | Freq: Four times a day (QID) | INTRAMUSCULAR | Status: DC | PRN

## 2022-11-28 MED ORDER — PHENOL 1.4 % MT LIQD: 1.0000 | OROMUCOSAL | Status: DC | PRN

## 2022-11-28 MED ORDER — PHENYLEPHRINE HCL-NACL 20-0.9 MG/250ML-% IV SOLN
INTRAVENOUS | Status: AC
  Filled 2022-11-28: qty 250

## 2022-11-28 MED ORDER — ACETAMINOPHEN 500 MG PO TABS
1000.0000 mg | ORAL_TABLET | Freq: Four times a day (QID) | ORAL | Status: AC
  Administered 2022-11-28 – 2022-11-29 (×4): 1000 mg via ORAL
  Filled 2022-11-28 (×3): qty 2

## 2022-11-28 MED ORDER — FENTANYL CITRATE (PF) 100 MCG/2ML IJ SOLN
INTRAMUSCULAR | Status: AC
  Filled 2022-11-28: qty 2

## 2022-11-28 MED ORDER — POLYETHYLENE GLYCOL 3350 17 G PO PACK
17.0000 g | PACK | Freq: Two times a day (BID) | ORAL | Status: DC
  Administered 2022-11-28: 17 g via ORAL
  Filled 2022-11-28 (×2): qty 1

## 2022-11-28 MED ORDER — OXYCODONE HCL 5 MG/5ML PO SOLN: 5.0000 mg | Freq: Once | ORAL | Status: DC | PRN

## 2022-11-28 MED ORDER — 0.9 % SODIUM CHLORIDE (POUR BTL) OPTIME
TOPICAL | Status: DC | PRN
  Administered 2022-11-28: 1000 mL

## 2022-11-28 MED ORDER — TRANEXAMIC ACID-NACL 1000-0.7 MG/100ML-% IV SOLN
1000.0000 mg | INTRAVENOUS | Status: AC
  Administered 2022-11-28: 1000 mg via INTRAVENOUS
  Filled 2022-11-28: qty 100

## 2022-11-28 MED ORDER — TRANEXAMIC ACID-NACL 1000-0.7 MG/100ML-% IV SOLN
1000.0000 mg | Freq: Once | INTRAVENOUS | Status: AC
  Administered 2022-11-28: 1000 mg via INTRAVENOUS
  Filled 2022-11-28: qty 100

## 2022-11-28 MED ORDER — DEXAMETHASONE SODIUM PHOSPHATE 10 MG/ML IJ SOLN
INTRAMUSCULAR | Status: AC
  Filled 2022-11-28: qty 1

## 2022-11-28 MED ORDER — CEFAZOLIN SODIUM-DEXTROSE 2-4 GM/100ML-% IV SOLN
2.0000 g | Freq: Four times a day (QID) | INTRAVENOUS | Status: AC
  Administered 2022-11-28 (×2): 2 g via INTRAVENOUS
  Filled 2022-11-28 (×2): qty 100

## 2022-11-28 MED ORDER — ACETAMINOPHEN 500 MG PO TABS: 1000.0000 mg | ORAL_TABLET | Freq: Once | ORAL | Status: DC | PRN

## 2022-11-28 MED ORDER — ACETAMINOPHEN 10 MG/ML IV SOLN: 1000.0000 mg | Freq: Once | INTRAVENOUS | Status: DC | PRN

## 2022-11-28 MED ORDER — ACETAMINOPHEN 500 MG PO TABS
ORAL_TABLET | ORAL | Status: AC
  Filled 2022-11-28: qty 2

## 2022-11-28 MED ORDER — ONDANSETRON HCL 4 MG PO TABS: 4.0000 mg | ORAL_TABLET | Freq: Four times a day (QID) | ORAL | Status: DC | PRN

## 2022-11-28 MED ORDER — ASPIRIN 81 MG PO CHEW
81.0000 mg | CHEWABLE_TABLET | Freq: Two times a day (BID) | ORAL | Status: DC
  Administered 2022-11-28 – 2022-11-29 (×2): 81 mg via ORAL
  Filled 2022-11-28 (×2): qty 1

## 2022-11-28 MED ORDER — HYDROMORPHONE HCL 1 MG/ML IJ SOLN: 0.5000 mg | INTRAMUSCULAR | Status: DC | PRN

## 2022-11-28 MED ORDER — DIPHENHYDRAMINE HCL 12.5 MG/5ML PO ELIX: 12.5000 mg | ORAL_SOLUTION | ORAL | Status: DC | PRN

## 2022-11-28 MED ORDER — SODIUM CHLORIDE (PF) 0.9 % IJ SOLN
INTRAMUSCULAR | Status: AC
  Filled 2022-11-28: qty 30

## 2022-11-28 MED ORDER — KETOROLAC TROMETHAMINE 30 MG/ML IJ SOLN
INTRAMUSCULAR | Status: AC
  Filled 2022-11-28: qty 1

## 2022-11-28 MED ORDER — FENTANYL CITRATE PF 50 MCG/ML IJ SOSY: 25.0000 ug | PREFILLED_SYRINGE | INTRAMUSCULAR | Status: DC | PRN

## 2022-11-28 MED ORDER — OXYCODONE HCL 5 MG PO TABS: 10.0000 mg | ORAL_TABLET | ORAL | Status: DC | PRN

## 2022-11-28 MED ORDER — KETOROLAC TROMETHAMINE 30 MG/ML IJ SOLN
INTRAMUSCULAR | Status: DC | PRN
  Administered 2022-11-28: 30 mg

## 2022-11-28 MED ORDER — MIDAZOLAM HCL 2 MG/2ML IJ SOLN
INTRAMUSCULAR | Status: AC
  Filled 2022-11-28: qty 2

## 2022-11-28 MED ORDER — OXYCODONE HCL 5 MG PO TABS: 5.0000 mg | ORAL_TABLET | Freq: Once | ORAL | Status: DC | PRN

## 2022-11-28 MED ORDER — ONDANSETRON HCL 4 MG/2ML IJ SOLN
INTRAMUSCULAR | Status: DC | PRN
  Administered 2022-11-28: 4 mg via INTRAVENOUS

## 2022-11-28 MED ORDER — CEFAZOLIN SODIUM-DEXTROSE 2-4 GM/100ML-% IV SOLN
2.0000 g | INTRAVENOUS | Status: AC
  Administered 2022-11-28: 2 g via INTRAVENOUS
  Filled 2022-11-28: qty 100

## 2022-11-28 MED ORDER — POVIDONE-IODINE 10 % EX SWAB: 2.0000 | Freq: Once | CUTANEOUS | Status: DC

## 2022-11-28 MED ORDER — METOCLOPRAMIDE HCL 5 MG/ML IJ SOLN: 5.0000 mg | Freq: Three times a day (TID) | INTRAMUSCULAR | Status: DC | PRN

## 2022-11-28 MED ORDER — CELECOXIB 200 MG PO CAPS
200.0000 mg | ORAL_CAPSULE | Freq: Two times a day (BID) | ORAL | Status: DC
  Administered 2022-11-28 – 2022-11-29 (×2): 200 mg via ORAL
  Filled 2022-11-28 (×2): qty 1

## 2022-11-28 MED ORDER — BUPIVACAINE-EPINEPHRINE (PF) 0.25% -1:200000 IJ SOLN
INTRAMUSCULAR | Status: AC
  Filled 2022-11-28: qty 30

## 2022-11-28 MED ORDER — METHOCARBAMOL 500 MG PO TABS
500.0000 mg | ORAL_TABLET | Freq: Four times a day (QID) | ORAL | Status: DC | PRN
  Administered 2022-11-28 – 2022-11-29 (×2): 500 mg via ORAL
  Filled 2022-11-28 (×2): qty 1

## 2022-11-28 MED ORDER — MENTHOL 3 MG MT LOZG: 1.0000 | LOZENGE | OROMUCOSAL | Status: DC | PRN

## 2022-11-28 MED ORDER — CHLORHEXIDINE GLUCONATE 0.12 % MT SOLN
15.0000 mL | Freq: Once | OROMUCOSAL | Status: AC
  Administered 2022-11-28: 15 mL via OROMUCOSAL

## 2022-11-28 SURGICAL SUPPLY — 40 items
ADH SKN CLS APL DERMABOND .7 (GAUZE/BANDAGES/DRESSINGS) ×1
BAG COUNTER SPONGE SURGICOUNT (BAG) IMPLANT
BAG DECANTER FOR FLEXI CONT (MISCELLANEOUS) IMPLANT
BAG SPEC THK2 15X12 ZIP CLS (MISCELLANEOUS)
BAG SPNG CNTER NS LX DISP (BAG)
BAG ZIPLOCK 12X15 (MISCELLANEOUS) IMPLANT
BLADE SAG 18X100X1.27 (BLADE) ×1 IMPLANT
COVER PERINEAL POST (MISCELLANEOUS) ×1 IMPLANT
COVER SURGICAL LIGHT HANDLE (MISCELLANEOUS) ×1 IMPLANT
CUP ACETBLR 52 OD PINNACLE (Hips) IMPLANT
DERMABOND ADVANCED .7 DNX12 (GAUZE/BANDAGES/DRESSINGS) ×1 IMPLANT
DRAPE FOOT SWITCH (DRAPES) ×1 IMPLANT
DRAPE STERI IOBAN 125X83 (DRAPES) ×1 IMPLANT
DRAPE U-SHAPE 47X51 STRL (DRAPES) ×2 IMPLANT
DRESSING AQUACEL AG SP 3.5X10 (GAUZE/BANDAGES/DRESSINGS) ×1 IMPLANT
DRSG AQUACEL AG SP 3.5X10 (GAUZE/BANDAGES/DRESSINGS) ×1
DURAPREP 26ML APPLICATOR (WOUND CARE) ×1 IMPLANT
ELECT REM PT RETURN 15FT ADLT (MISCELLANEOUS) ×1 IMPLANT
GLOVE BIO SURGEON STRL SZ 6 (GLOVE) ×1 IMPLANT
GLOVE BIOGEL PI IND STRL 6.5 (GLOVE) ×1 IMPLANT
GLOVE BIOGEL PI IND STRL 7.5 (GLOVE) ×1 IMPLANT
GLOVE ORTHO TXT STRL SZ7.5 (GLOVE) ×2 IMPLANT
GOWN STRL REUS W/ TWL LRG LVL3 (GOWN DISPOSABLE) ×2 IMPLANT
GOWN STRL REUS W/TWL LRG LVL3 (GOWN DISPOSABLE) ×2
HEAD CERAMIC DELTA 36 PLUS 1.5 (Hips) IMPLANT
HOLDER FOLEY CATH W/STRAP (MISCELLANEOUS) ×1 IMPLANT
KIT TURNOVER KIT A (KITS) IMPLANT
LINER NEUTRAL 52X36MM PLUS 4 (Liner) IMPLANT
PACK ANTERIOR HIP CUSTOM (KITS) ×1 IMPLANT
SCREW 6.5MMX30MM (Screw) IMPLANT
STEM FEMORAL SZ6 HIGH ACTIS (Stem) IMPLANT
SUT MNCRL AB 4-0 PS2 18 (SUTURE) ×1 IMPLANT
SUT STRATAFIX 0 PDS 27 VIOLET (SUTURE) ×1
SUT VIC AB 1 CT1 36 (SUTURE) ×3 IMPLANT
SUT VIC AB 2-0 CT1 27 (SUTURE) ×2
SUT VIC AB 2-0 CT1 TAPERPNT 27 (SUTURE) ×2 IMPLANT
SUTURE STRATFX 0 PDS 27 VIOLET (SUTURE) ×1 IMPLANT
TRAY FOLEY MTR SLVR 16FR STAT (SET/KITS/TRAYS/PACK) IMPLANT
TUBE SUCTION HIGH CAP CLEAR NV (SUCTIONS) ×1 IMPLANT
WATER STERILE IRR 1000ML POUR (IV SOLUTION) ×1 IMPLANT

## 2022-11-28 NOTE — Plan of Care (Signed)
  Problem: Education: Goal: Knowledge of the prescribed therapeutic regimen will improve Outcome: Progressing   Problem: Activity: Goal: Ability to avoid complications of mobility impairment will improve Outcome: Progressing   Problem: Pain Management: Goal: Pain level will decrease with appropriate interventions Outcome: Progressing   Problem: Education: Goal: Knowledge of General Education information will improve Description: Including pain rating scale, medication(s)/side effects and non-pharmacologic comfort measures Outcome: Progressing   Problem: Activity: Goal: Risk for activity intolerance will decrease Outcome: Progressing   Problem: Nutrition: Goal: Adequate nutrition will be maintained Outcome: Progressing   

## 2022-11-28 NOTE — Anesthesia Preprocedure Evaluation (Addendum)
Anesthesia Evaluation  Patient identified by MRN, date of birth, ID band Patient awake    Reviewed: Allergy & Precautions, NPO status , Patient's Chart, lab work & pertinent test results  History of Anesthesia Complications Negative for: history of anesthetic complications  Airway Mallampati: II  TM Distance: >3 FB Neck ROM: Full    Dental  (+) Dental Advisory Given, Teeth Intact   Pulmonary neg shortness of breath, neg sleep apnea, neg COPD, neg recent URI   breath sounds clear to auscultation       Cardiovascular negative cardio ROS  Rhythm:Regular     Neuro/Psych negative neurological ROS     GI/Hepatic negative GI ROS, Neg liver ROS,,,  Endo/Other  Hypothyroidism    Renal/GU negative Renal ROSLab Results      Component                Value               Date                      NA                       139                 08/28/2018                K                        4.1                 08/28/2018                CO2                      24                  08/28/2018                GLUCOSE                  113 (H)             08/28/2018                BUN                      18                  08/28/2018                CREATININE               0.64                08/28/2018                CALCIUM                  8.4 (L)             08/28/2018                GFRNONAA                 >60                 08/28/2018  Musculoskeletal  (+) Arthritis ,    Abdominal   Peds  Hematology negative hematology ROS (+) Lab Results      Component                Value               Date                      WBC                      12.8 (H)            08/28/2018                HGB                      10.6 (L)            08/28/2018                HCT                      32.5 (L)            08/28/2018                MCV                      96.2                08/28/2018                PLT                       217                 08/28/2018            Denies blood thinners   Anesthesia Other Findings   Reproductive/Obstetrics                             Anesthesia Physical Anesthesia Plan  ASA: 2  Anesthesia Plan: MAC and Spinal   Post-op Pain Management:    Induction: Intravenous  PONV Risk Score and Plan: 2 and Propofol infusion and Treatment may vary due to age or medical condition  Airway Management Planned: Nasal Cannula and Natural Airway  Additional Equipment: None  Intra-op Plan:   Post-operative Plan:   Informed Consent: I have reviewed the patients History and Physical, chart, labs and discussed the procedure including the risks, benefits and alternatives for the proposed anesthesia with the patient or authorized representative who has indicated his/her understanding and acceptance.     Dental advisory given  Plan Discussed with: CRNA  Anesthesia Plan Comments:        Anesthesia Quick Evaluation

## 2022-11-28 NOTE — Evaluation (Signed)
Physical Therapy Evaluation Patient Details Name: Suzanne Gibson MRN: [id number] DOB: 27-Jul-1950 Today's Date: 11/28/2022  History of Present Illness  72 yo female s/p R THA-DA 11/28/22. Hx of L THA-DA 2/20.  Clinical Impression  On eval POD 0, pt was Min guard A for mobility. She walked ~120 feet with a RW. Moderate pain with activity. Will plan to follow and progress activity as tolerated. Plan is for possible d/c home on tomorrow if pain is controlled and pt meets her PT goals.        Recommendations for follow up therapy are one component of a multi-disciplinary discharge planning process, led by the attending physician.  Recommendations may be updated based on patient status, additional functional criteria and insurance authorization.  Follow Up Recommendations       Assistance Recommended at Discharge    Patient can return home with the following  A little help with walking and/or transfers;A little help with bathing/dressing/bathroom;Assistance with cooking/housework;Assist for transportation;Help with stairs or ramp for entrance    Equipment Recommendations Rolling walker (2 wheels)  Recommendations for Other Services       Functional Status Assessment Patient has had a recent decline in their functional status and demonstrates the ability to make significant improvements in function in a reasonable and predictable amount of time.     Precautions / Restrictions Precautions Precautions: Fall Restrictions Weight Bearing Restrictions: No Other Position/Activity Restrictions: WBAT      Mobility  Bed Mobility Overal bed mobility: Needs Assistance Bed Mobility: Supine to Sit     Supine to sit: Min assist, HOB elevated     General bed mobility comments: Assist for R LE off bed. Cues provided.    Transfers Overall transfer level: Needs assistance Equipment used: Rolling walker (2 wheels) Transfers: Sit to/from Stand Sit to Stand: Min guard, From elevated surface            General transfer comment: Min guard A for safety. Cues for safety, hand/LE placement.    Ambulation/Gait Ambulation/Gait assistance: Min guard Gait Distance (Feet): 120 Feet Assistive device: Rolling walker (2 wheels) Gait Pattern/deviations: Step-to pattern, Step-through pattern, Decreased stride length       General Gait Details: Went back and forth between step to and step thru gait pattern. Min guard A for safety. No LOB with RW use. Pt denied dizziness.  Stairs            Wheelchair Mobility    Modified Rankin (Stroke Patients Only)       Balance Overall balance assessment: Needs assistance         Standing balance support: During functional activity, Reliant on assistive device for balance Standing balance-Leahy Scale: Fair                               Pertinent Vitals/Pain Pain Assessment Pain Assessment: 0-10 Pain Score: 5  Pain Location: R hip/thigh Pain Descriptors / Indicators: Discomfort, Sore Pain Intervention(s): Limited activity within patient's tolerance, Monitored during session, Ice applied, Repositioned    Home Living Family/patient expects to be discharged to:: Private residence Living Arrangements: Spouse/significant other   Type of Home: House Home Access: Stairs to enter Entrance Stairs-Rails: None Entrance Stairs-Number of Steps: 2   Home Layout: One level Home Equipment: None Additional Comments: staying at a friend's home. permanent residence is in [place]    Prior Function Prior Level of Function : Independent/Modified Independent  Hand Dominance        Extremity/Trunk Assessment   Upper Extremity Assessment Upper Extremity Assessment: Overall WFL for tasks assessed    Lower Extremity Assessment Lower Extremity Assessment: Generalized weakness    Cervical / Trunk Assessment Cervical / Trunk Assessment: Normal  Communication   Communication: No difficulties   Cognition Arousal/Alertness: Awake/alert Behavior During Therapy: WFL for tasks assessed/performed Overall Cognitive Status: Within Functional Limits for tasks assessed                                          General Comments      Exercises     Assessment/Plan    PT Assessment Patient needs continued PT services  PT Problem List Decreased strength;Decreased range of motion;Decreased activity tolerance;Decreased balance;Decreased mobility;Decreased knowledge of use of DME;Pain       PT Treatment Interventions DME instruction;Gait training;Functional mobility training;Therapeutic activities;Patient/family education;Therapeutic exercise;Balance training    PT Goals (Current goals can be found in the Care Plan section)  Acute Rehab PT Goals Patient Stated Goal: regain PLOF/independence PT Goal Formulation: With patient/family Time For Goal Achievement: 12/12/22 Potential to Achieve Goals: Good    Frequency 7X/week     Co-evaluation               AM-PAC PT "6 Clicks" Mobility  Outcome Measure Help needed turning from your back to your side while in a flat bed without using bedrails?: A Little Help needed moving from lying on your back to sitting on the side of a flat bed without using bedrails?: A Little Help needed moving to and from a bed to a chair (including a wheelchair)?: A Little Help needed standing up from a chair using your arms (e.g., wheelchair or bedside chair)?: A Little Help needed to walk in hospital room?: A Little Help needed climbing 3-5 steps with a railing? : A Little 6 Click Score: 18    End of Session Equipment Utilized During Treatment: Gait belt Activity Tolerance: Patient tolerated treatment well Patient left: in chair;with call bell/phone within reach;with chair alarm set;with family/visitor present   PT Visit Diagnosis: Pain;Other abnormalities of gait and mobility (R26.89) Pain - Right/Left: Right Pain - part of  body: Hip    Time: 1610-9604 PT Time Calculation (min) (ACUTE ONLY): 36 min   Charges:   PT Evaluation $PT Eval Low Complexity: 1 Low PT Treatments $Gait Training: 8-22 mins          Faye Ramsay, PT Acute Rehabilitation  Office: [phone]

## 2022-11-28 NOTE — Op Note (Signed)
NAME:  Suzanne Gibson                ACCOUNT NO.: [id number]      MEDICAL RECORD NO.: [id number]      FACILITY:  [facility]      PHYSICIAN:  Shelda Pal  DATE OF BIRTH:  13-Jun-1951     DATE OF PROCEDURE:  11/28/2022                                 OPERATIVE REPORT         PREOPERATIVE DIAGNOSIS: Right  hip osteoarthritis.      POSTOPERATIVE DIAGNOSIS:  Right hip osteoarthritis.      PROCEDURE:  Right total hip replacement through an anterior approach   utilizing DePuy THR system, component size 52 pinnacle cup, a size 36+4 neutral   Altrex liner, a size 6 Hi Actis stem with a 36+1.5 delta ceramic   ball.      SURGEON:  Madlyn Frankel. Charlann Boxer, M.D.      ASSISTANT:  Rosalene Billings, PA-C     ANESTHESIA:  Spinal.      SPECIMENS:  None.      COMPLICATIONS:  None.      BLOOD LOSS:  200 cc     DRAINS:  None.      INDICATION OF THE PROCEDURE:  Suzanne Gibson is a 72 y.o. female who had   presented to office for evaluation of right hip pain.  Radiographs revealed   progressive degenerative changes with bone-on-bone   articulation of the  hip joint, including subchondral cystic changes and osteophytes.  The patient had painful limited range of   motion significantly affecting their overall quality of life and function.  The patient was failing to    respond to conservative measures including medications and/or injections and activity modification and at this point was ready   to proceed with more definitive measures.  Consent was obtained for   benefit of pain relief.  Specific risks of infection, DVT, component   failure, dislocation, neurovascular injury, and need for revision surgery were reviewed in the office.     PROCEDURE IN DETAIL:  The patient was brought to operative theater.   Once adequate anesthesia, preoperative antibiotics, 2 gm of Ancef, 1 gm of Tranexamic Acid, and 10 mg of Decadron were administered, the patient was positioned supine on the [employer] table.  Once the patient was safely positioned with adequate padding of boney prominences we predraped out the hip, and used fluoroscopy to confirm orientation of the pelvis.      The right hip was then prepped and draped from proximal iliac crest to   mid thigh with a shower curtain technique.      Time-out was performed identifying the patient, planned procedure, and the appropriate extremity.     An incision was then made 2 cm lateral to the   anterior superior iliac spine extending over the orientation of the   tensor fascia lata muscle and sharp dissection was carried down to the   fascia of the muscle.      The fascia was then incised.  The muscle belly was identified and swept   laterally and retractor placed along the superior neck.  Following   cauterization of the circumflex vessels and removing some pericapsular   fat, a second cobra retractor was placed on the inferior neck.  A T-capsulotomy was made along the line of the   superior neck to the trochanteric fossa, then extended proximally and   distally.  Tag sutures were placed and the retractors were then placed   intracapsular.  We then identified the trochanteric fossa and   orientation of my neck cut and then made a neck osteotomy with the femur on traction.  The femoral   head was removed without difficulty or complication.  Traction was let   off and retractors were placed posterior and anterior around the   acetabulum.      The labrum and foveal tissue were debrided.  I began reaming with a 46 mm   reamer and reamed up to 51 mm reamer with good bony bed preparation and a 52 mm  cup was chosen.  The final 52 mm Pinnacle cup was then impacted under fluoroscopy to confirm the depth of penetration and orientation with respect to   Abduction and forward flexion.  A screw was placed into the ilium followed by the hole eliminator.  The final   36+4 neutral Altrex liner was impacted with good visualized rim fit.  The cup  was positioned anatomically within the acetabular portion of the pelvis.      At this point, the femur was rolled to 100 degrees.  Further capsule was   released off the inferior aspect of the femoral neck.  I then   released the superior capsule proximally.  With the leg in a neutral position the hook was placed laterally   along the femur under the vastus lateralis origin and elevated manually and then held in position using the hook attachment on the bed.  The leg was then extended and adducted with the leg rolled to 100   degrees of external rotation.  Retractors were placed along the medial calcar and posteriorly over the greater trochanter.  Once the proximal femur was fully   exposed, I used a box osteotome to set orientation.  I then began   broaching with the starting chili pepper broach and passed this by hand and then broached up to 6.  With the 6 broach in place I chose a high offset neck and did several trial reductions.  The offset was appropriate, leg lengths   appeared to be equal best matched with the +1.5 head ball trial confirmed radiographically.   Given these findings, I went ahead and dislocated the hip, repositioned all   retractors and positioned the right hip in the extended and abducted position.  The final 6 Hi Actis stem was   chosen and it was impacted down to the level of neck cut.  Based on this   and the trial reductions, a final 36+1.5 delta ceramic ball was chosen and   impacted onto a clean and dry trunnion, and the hip was reduced.  The   hip had been irrigated throughout the case again at this point.  I did   reapproximate the superior capsular leaflet to the anterior leaflet   using #1 Vicryl.  The fascia of the   tensor fascia lata muscle was then reapproximated using #1 Vicryl and #0 Stratafix sutures.  The   remaining wound was closed with 2-0 Vicryl and running 4-0 Monocryl.   The hip was cleaned, dried, and dressed sterilely using Dermabond and    Aquacel dressing.  The patient was then brought   to recovery room in stable condition tolerating the procedure well.    Rosalene Billings, PA-C  was present for the entirety of the case involved from   preoperative positioning, perioperative retractor management, general   facilitation of the case, as well as primary wound closure as assistant.            Madlyn Frankel Charlann Boxer, M.D.        11/28/2022 8:45 AM

## 2022-11-28 NOTE — Anesthesia Procedure Notes (Signed)
Spinal  Patient location during procedure: OR Start time: 11/28/2022 8:46 AM End time: 11/28/2022 8:49 AM Reason for block: surgical anesthesia Staffing Performed: resident/CRNA  Resident/CRNA: Brynda Peon, CRNA Performed by: Brynda Peon, CRNA Authorized by: Val Eagle, MD   Preanesthetic Checklist Completed: patient identified, IV checked, site marked, risks and benefits discussed, surgical consent, monitors and equipment checked, pre-op evaluation and timeout performed Spinal Block Patient position: sitting Prep: Betadine Patient monitoring: heart rate, cardiac monitor, continuous pulse ox and blood pressure Approach: midline Location: L4-5 Injection technique: single-shot Needle Needle type: Pencan  Needle gauge: 24 G Needle length: 10 cm Assessment Sensory level: T6 Events: CSF return

## 2022-11-28 NOTE — Transfer of Care (Signed)
Immediate Anesthesia Transfer of Care Note  Patient: Suzanne Gibson  Procedure(s) Performed: TOTAL HIP ARTHROPLASTY ANTERIOR APPROACH (Right: Hip)  Patient Location: PACU  Anesthesia Type:Spinal  Level of Consciousness: awake, alert , oriented, patient cooperative, and responds to stimulation  Airway & Oxygen Therapy: Patient Spontanous Breathing  Post-op Assessment: Report given to RN and Post -op Vital signs reviewed and stable  Post vital signs: Reviewed and stable  Last Vitals:  Vitals Value Taken Time  BP    Temp    Pulse 80 11/28/22 1012  Resp 17 11/28/22 1012  SpO2 98 % 11/28/22 1012  Vitals shown include unvalidated device data.  Last Pain:  Vitals:   11/28/22 0658  TempSrc:   PainSc: 0-No pain         Complications: No notable events documented.

## 2022-11-28 NOTE — Interval H&P Note (Signed)
History and Physical Interval Note:  11/28/2022 7:42 AM  Suzanne Gibson  has presented today for surgery, with the diagnosis of Right hip osteoarthritis.  The various methods of treatment have been discussed with the patient and family. After consideration of risks, benefits and other options for treatment, the patient has consented to  Procedure(s): TOTAL HIP ARTHROPLASTY ANTERIOR APPROACH (Right) as a surgical intervention.  The patient's history has been reviewed, patient examined, no change in status, stable for surgery.  I have reviewed the patient's chart and labs.  Questions were answered to the patient's satisfaction.     Shelda Pal

## 2022-11-28 NOTE — Discharge Instructions (Signed)

## 2022-11-29 ENCOUNTER — Encounter (HOSPITAL_COMMUNITY): Payer: Self-pay | Admitting: Orthopedic Surgery

## 2022-11-29 DIAGNOSIS — M1611 Unilateral primary osteoarthritis, right hip: Secondary | ICD-10-CM | POA: Diagnosis not present

## 2022-11-29 LAB — BASIC METABOLIC PANEL
Anion gap: 7 (ref 5–15)
BUN: 25 mg/dL — ABNORMAL HIGH (ref 8–23)
CO2: 23 mmol/L (ref 22–32)
Calcium: 8.5 mg/dL — ABNORMAL LOW (ref 8.9–10.3)
Chloride: 107 mmol/L (ref 98–111)
Creatinine, Ser: 0.94 mg/dL (ref 0.44–1.00)
GFR, Estimated: >60 mL/min (ref >60)
Glucose, Bld: 141 mg/dL — ABNORMAL HIGH (ref 70–99)
Potassium: 4.2 mmol/L (ref 3.5–5.1)
Sodium: 137 mmol/L (ref 135–145)

## 2022-11-29 LAB — CBC
HCT: 33.6 % — ABNORMAL LOW (ref 36.0–46.0)
Hemoglobin: 11.1 g/dL — ABNORMAL LOW (ref 12.0–15.0)
MCH: 30.8 pg (ref 26.0–34.0)
MCHC: 33 g/dL (ref 30.0–36.0)
MCV: 93.3 fL (ref 80.0–100.0)
Platelets: 228 10*3/uL (ref 150–400)
RBC: 3.6 MIL/uL — ABNORMAL LOW (ref 3.87–5.11)
RDW: 14.6 % (ref 11.5–15.5)
WBC: 15.6 10*3/uL — ABNORMAL HIGH (ref 4.0–10.5)
nRBC: 0 % (ref 0.0–0.2)

## 2022-11-29 MED ORDER — METHOCARBAMOL 500 MG PO TABS: 500.0000 mg | ORAL_TABLET | Freq: Four times a day (QID) | ORAL | 2 refills | Status: AC | PRN

## 2022-11-29 MED ORDER — ASPIRIN 81 MG PO CHEW: 81.0000 mg | CHEWABLE_TABLET | Freq: Two times a day (BID) | ORAL | 0 refills | Status: AC

## 2022-11-29 MED ORDER — CELECOXIB 200 MG PO CAPS: 200.0000 mg | ORAL_CAPSULE | Freq: Two times a day (BID) | ORAL | 0 refills | Status: AC

## 2022-11-29 MED ORDER — OXYCODONE HCL 5 MG PO TABS: 5.0000 mg | ORAL_TABLET | ORAL | 0 refills | Status: AC | PRN

## 2022-11-29 NOTE — Progress Notes (Signed)
Physical Therapy Treatment Patient Details Name: Suzanne Gibson MRN: [id number] DOB: [birth year] Today's Date: 11/29/2022   History of Present Illness 72 yo female s/p R THA-DA 11/28/22. Hx of L THA-DA 2/20.    PT Comments    Pt continues to participate well. Reviewed/practiced exercises, gait training, and stair training. Issued HEP for pt to follow. Encouraged pt to ambulate often, as tolerated. All PT education completed.    Recommendations for follow up therapy are one component of a multi-disciplinary discharge planning process, led by the attending physician.  Recommendations may be updated based on patient status, additional functional criteria and insurance authorization.  Follow Up Recommendations       Assistance Recommended at Discharge PRN  Patient can return home with the following A little help with walking and/or transfers;A little help with bathing/dressing/bathroom;Assistance with cooking/housework;Assist for transportation;Help with stairs or ramp for entrance   Equipment Recommendations  Rolling walker (2 wheels)    Recommendations for Other Services       Precautions / Restrictions Precautions Precautions: Fall Restrictions Weight Bearing Restrictions: No Other Position/Activity Restrictions: WBAT     Mobility  Bed Mobility Overal bed mobility: Needs Assistance Bed Mobility: Supine to Sit, Sit to Supine     Supine to sit: Supervision, HOB elevated Sit to supine: Supervision, HOB elevated   General bed mobility comments: Pt used gait belt as leg lifter.    Transfers Overall transfer level: Needs assistance Equipment used: Rolling walker (2 wheels) Transfers: Sit to/from Stand Sit to Stand: Supervision           General transfer comment: Supv for safety. Cues for safety, hand/LE placement.    Ambulation/Gait Ambulation/Gait assistance: Supervision Gait Distance (Feet): 200 Feet Assistive device: Rolling walker (2 wheels) Gait  Pattern/deviations: Step-through pattern, Decreased stride length       General Gait Details: Supv for safety.   Stairs Stairs: Yes Stairs assistance: Min assist Stair Management: Step to pattern, Backwards, With walker Number of Stairs: 6 General stair comments: Practiced x 2-once with therapist, once with significant other. Cues for safety, technique, sequence.   Wheelchair Mobility    Modified Rankin (Stroke Patients Only)       Balance Overall balance assessment: Needs assistance         Standing balance support: During functional activity, Reliant on assistive device for balance Standing balance-Leahy Scale: Fair                              Cognition Arousal/Alertness: Awake/alert Behavior During Therapy: WFL for tasks assessed/performed Overall Cognitive Status: Within Functional Limits for tasks assessed                                          Exercises Total Joint Exercises Ankle Circles/Pumps: AROM, Both, 10 reps Quad Sets: AROM, Both, 10 reps Heel Slides: AAROM, Right, 10 reps (pt used gait belt) Hip ABduction/ADduction: AAROM, Right, 10 reps, Standing, Supine (pt used gait belt in supine) Knee Flexion: AROM, Right, 10 reps, Standing Marching in Standing: AROM, 10 reps, Standing General Exercises - Lower Extremity Heel Raises: AROM, 10 reps, Standing    General Comments        Pertinent Vitals/Pain Pain Assessment Pain Assessment: 0-10 Pain Score: 4  Pain Location: R hip/thigh Pain Descriptors / Indicators: Discomfort, Sore Pain Intervention(s): Monitored during session, Ice  applied    Home Living                          Prior Function            PT Goals (current goals can now be found in the care plan section) Progress towards PT goals: Progressing toward goals    Frequency    7X/week      PT Plan Current plan remains appropriate    Co-evaluation              AM-PAC PT "6  Clicks" Mobility   Outcome Measure  Help needed turning from your back to your side while in a flat bed without using bedrails?: None Help needed moving from lying on your back to sitting on the side of a flat bed without using bedrails?: None Help needed moving to and from a bed to a chair (including a wheelchair)?: None Help needed standing up from a chair using your arms (e.g., wheelchair or bedside chair)?: None Help needed to walk in hospital room?: None Help needed climbing 3-5 steps with a railing? : A Little 6 Click Score: 23    End of Session Equipment Utilized During Treatment: Gait belt Activity Tolerance: Patient tolerated treatment well Patient left: in bed;with call bell/phone within reach;with family/visitor present   PT Visit Diagnosis: Pain;Other abnormalities of gait and mobility (R26.89) Pain - Right/Left: Right Pain - part of body: Hip     Time: 1610-9604 PT Time Calculation (min) (ACUTE ONLY): 34 min  Charges:  $Gait Training: 8-22 mins $Therapeutic Exercise: 8-22 mins                         Faye Ramsay, PT Acute Rehabilitation  Office: [phone]

## 2022-11-29 NOTE — Progress Notes (Signed)
   Subjective: 1 Day Post-Op Procedure(s) (LRB): TOTAL HIP ARTHROPLASTY ANTERIOR APPROACH (Right) Patient reports pain as mild.   Patient seen in rounds with Dr. Charlann Boxer. Patient is resting in bed on exam this morning. Her husband was at the bedside with her. No acute events overnight. Foley catheter removed. Patient ambulated 120 feet with PT yesterday. We will start therapy today.   Objective: Vital signs in last 24 hours: Temp:  [97.4 F (36.3 C)-98.7 F (37.1 C)] 98.6 F (37 C) (05/15 0530) Pulse Rate:  [53-80] 61 (05/15 0530) Resp:  [9-20] 17 (05/15 0530) BP: (104-131)/(57-72) 110/65 (05/15 0530) SpO2:  [97 %-100 %] 97 % (05/15 0530)  Intake/Output from previous day:  Intake/Output Summary (Last 24 hours) at 11/29/2022 0830 Last data filed at 11/29/2022 0630 Gross per 24 hour  Intake 3979.13 ml  Output 4050 ml  Net -70.87 ml     Intake/Output this shift: No intake/output data recorded.  Labs: Recent Labs    11/29/22 0349  HGB 11.1*   Recent Labs    11/29/22 0349  WBC 15.6*  RBC 3.60*  HCT 33.6*  PLT 228   Recent Labs    11/29/22 0349  NA 137  K 4.2  CL 107  CO2 23  BUN 25*  CREATININE 0.94  GLUCOSE 141*  CALCIUM 8.5*   No results for input(s): "LABPT", "INR" in the last 72 hours.  Exam: General - Patient is Alert and Oriented Extremity - Neurologically intact Sensation intact distally Intact pulses distally Dorsiflexion/Plantar flexion intact Dressing - dressing C/D/I Motor Function - intact, moving foot and toes well on exam.   Past Medical History:  Diagnosis Date   Arthritis    Hypothyroidism    Pneumonia     Assessment/Plan: 1 Day Post-Op Procedure(s) (LRB): TOTAL HIP ARTHROPLASTY ANTERIOR APPROACH (Right) Principal Problem:   S/P total right hip arthroplasty  Estimated body mass index is 21.93 kg/m as calculated from the following:   Height as of this encounter: 5\' 7"  (1.702 m).   Weight as of this encounter: 63.5 kg. Advance  diet Up with therapy D/C IV fluids  DVT Prophylaxis - Aspirin Weight bearing as tolerated.  Hgb stable at 11.1 this AM.   Plan is to go Home after hospital stay. Plan for discharge today after meeting goals with therapy. Follow up in the office in 2 weeks.   Dennie Bible, PA-C Orthopedic Surgery [phone] 11/29/2022, 8:30 AM

## 2022-11-29 NOTE — Progress Notes (Signed)
Discharge package printed and instructions given to patient. Verbalizes understanding.  

## 2022-11-29 NOTE — Plan of Care (Signed)
  Problem: Activity: Goal: Ability to avoid complications of mobility impairment will improve Outcome: Progressing Goal: Ability to tolerate increased activity will improve Outcome: Progressing   Problem: Pain Management: Goal: Pain level will decrease with appropriate interventions Outcome: Progressing   Problem: Safety: Goal: Ability to remain free from injury will improve Outcome: Progressing   

## 2022-11-29 NOTE — TOC Transition Note (Signed)
Transition of Care [facility]) - CM/SW Discharge Note  Patient Details  Name: Suzanne Gibson MRN: [id number] Date of Birth: August 29, 1950  Transition of Care [facility]) CM/SW Contact:  Ewing Schlein, LCSW Phone Number: 11/29/2022, 10:24 AM  Clinical Narrative: Patient is expected to discharge home after working with PT. CSW met with patient to confirm discharge plan and needs. Patient will go home with a home exercise program (HEP). Patient will need a rolling walker and asked about a raised toilet seat and shower. [employer] will provide the rolling walker, but does not carry raised toilet seats and shower chairs. Patient provided options in the community for additional DME needs. TOC signing off.    Final next level of care: Home/Self Care Barriers to Discharge: No Barriers Identified  Patient Goals and CMS Choice CMS Medicare.gov Compare Post Acute Care list provided to:: Patient Choice offered to / list presented to : Patient  Discharge Plan and Services Additional resources added to the After Visit Summary for          DME Arranged: Walker rolling DME Agency: [employer] Date DME Agency Contacted: 11/29/22 Representative spoke with at DME Agency: Yvonna Alanis  Social Determinants of Health (SDOH) Interventions SDOH Screenings   Food Insecurity: No Food Insecurity (11/28/2022)  Housing: Low Risk  (11/28/2022)  Transportation Needs: No Transportation Needs (11/28/2022)  Utilities: Not At Risk (11/28/2022)  Tobacco Use: Low Risk  (11/29/2022)   Readmission Risk Interventions     No data to display

## 2022-12-02 ENCOUNTER — Encounter (HOSPITAL_COMMUNITY): Payer: Self-pay | Admitting: Orthopedic Surgery

## 2022-12-02 NOTE — Anesthesia Postprocedure Evaluation (Signed)
Anesthesia Post Note  Patient: Suzanne Gibson  Procedure(s) Performed: TOTAL HIP ARTHROPLASTY ANTERIOR APPROACH (Right: Hip)     Patient location during evaluation: PACU Anesthesia Type: MAC and Spinal Level of consciousness: awake and alert Pain management: pain level controlled Vital Signs Assessment: post-procedure vital signs reviewed and stable Respiratory status: spontaneous breathing, nonlabored ventilation and respiratory function stable Cardiovascular status: stable and blood pressure returned to baseline Postop Assessment: no apparent nausea or vomiting Anesthetic complications: no   No notable events documented.  Last Vitals:  Vitals:   11/29/22 0530 11/29/22 0917  BP: 110/65 (!) 125/57  Pulse: 61 69  Resp: 17 18  Temp: 37 C 36.6 C  SpO2: 97% 100%    Last Pain:  Vitals:   11/29/22 0736  TempSrc:   PainSc: 0-No pain                 Nyle Limb

## 2022-12-07 NOTE — Discharge Summary (Signed)
Patient ID: Suzanne Gibson MRN: [id number] DOB/AGE: [birth year] 72 y.o.  Admit date: 11/28/2022 Discharge date: 11/29/2022  Admission Diagnoses:  Right hip osteoarthritis  Discharge Diagnoses:  Principal Problem:   S/P total right hip arthroplasty   Past Medical History:  Diagnosis Date   Arthritis    Hypothyroidism    Pneumonia     Surgeries: Procedure(s): TOTAL HIP ARTHROPLASTY ANTERIOR APPROACH on 11/28/2022   Consultants:   Discharged Condition: Improved  Hospital Course: Suzanne Gibson is an 72 y.o. female who was admitted 11/28/2022 for operative treatment ofS/P total right hip arthroplasty. Patient has severe unremitting pain that affects sleep, daily activities, and work/hobbies. After pre-op clearance the patient was taken to the operating room on 11/28/2022 and underwent  Procedure(s): TOTAL HIP ARTHROPLASTY ANTERIOR APPROACH.    Patient was given perioperative antibiotics:  Anti-infectives (From admission, onward)    Start     Dose/Rate Route Frequency Ordered Stop   11/28/22 1500  ceFAZolin (ANCEF) IVPB 2g/100 mL premix        2 g 200 mL/hr over 30 Minutes Intravenous Every 6 hours 11/28/22 1358 11/28/22 2136   11/28/22 0630  ceFAZolin (ANCEF) IVPB 2g/100 mL premix        2 g 200 mL/hr over 30 Minutes Intravenous On call to O.R. 11/28/22 4098 11/28/22 0920        Patient was given sequential compression devices, early ambulation, and chemoprophylaxis to prevent DVT. Patient worked with PT and was meeting their goals regarding safe ambulation and transfers.  Patient benefited maximally from hospital stay and there were no complications.    Recent vital signs: No data found.   Recent laboratory studies: No results for input(s): "WBC", "HGB", "HCT", "PLT", "NA", "K", "CL", "CO2", "BUN", "CREATININE", "GLUCOSE", "INR", "CALCIUM" in the last 72 hours.  Invalid input(s): "PT", "2"   Discharge Medications:   Allergies as of 11/29/2022       Reactions    Augmentin [amoxicillin-pot Clavulanate] Rash   Levofloxacin Rash   Headache        Medication List     TAKE these medications    acetaminophen 500 MG tablet Commonly known as: TYLENOL Take 2 tablets (1,000 mg total) by mouth every 8 (eight) hours.   aspirin 81 MG chewable tablet Chew 1 tablet (81 mg total) by mouth 2 (two) times daily for 28 days.   celecoxib 200 MG capsule Commonly known as: CELEBREX Take 1 capsule (200 mg total) by mouth 2 (two) times daily.   diazepam 5 MG tablet Commonly known as: VALIUM Take 2.5 mg by mouth as needed for anxiety.   docusate sodium 100 MG capsule Commonly known as: Colace Take 1 capsule (100 mg total) by mouth 2 (two) times daily.   ferrous sulfate 325 (65 FE) MG tablet Commonly known as: FerrouSul Take 1 tablet (325 mg total) by mouth 3 (three) times daily with meals.   methocarbamol 500 MG tablet Commonly known as: ROBAXIN Take 1 tablet (500 mg total) by mouth every 6 (six) hours as needed for muscle spasms (muscle pain). What changed: reasons to take this   multivitamin tablet Take 1 tablet by mouth daily.   oxyCODONE 5 MG immediate release tablet Commonly known as: Oxy IR/ROXICODONE Take 1 tablet (5 mg total) by mouth every 4 (four) hours as needed for severe pain.   polyethylene glycol 17 g packet Commonly known as: MIRALAX / GLYCOLAX Take 17 g by mouth 2 (two) times daily.   thyroid 90 MG  tablet Commonly known as: ARMOUR Take 90 mg by mouth See admin instructions.               Discharge Care Instructions  (From admission, onward)           Start     Ordered   11/29/22 0000  Change dressing       Comments: Maintain surgical dressing until follow up in the clinic. If the edges start to pull up, may reinforce with tape. If the dressing is no longer working, may remove and cover with gauze and tape, but must keep the area dry and clean.  Call with any questions or concerns.   11/29/22 0834             Diagnostic Studies: DG Pelvis Portable  Result Date: 11/28/2022 CLINICAL DATA:  Status post right hip arthroplasty. EXAM: PORTABLE PELVIS 1-2 VIEWS COMPARISON:  Operative images obtained earlier today.  08/27/2018. FINDINGS: Single AP view demonstrates a well positioned and seated and well aligned right total hip arthroplasty. No fracture or evidence of an operative complication. Left total hip arthroplasty is stable, well seated and well-positioned. IMPRESSION: Well-positioned right total hip arthroplasty. Electronically Signed   By: Amie Portland M.D.   On: 11/28/2022 10:24   DG HIP UNILAT WITH PELVIS 1V RIGHT  Result Date: 11/28/2022 CLINICAL DATA:  [id number] Surgery, elective [id number] EXAM: DG HIP (WITH OR WITHOUT PELVIS) 1V RIGHT COMPARISON:  06/03/2018 FINDINGS: Five C-arm fluoroscopic images were obtained intraoperatively and submitted for post operative interpretation. Images demonstrate placement of right total hip arthroplasty hardware. 18 seconds of fluoroscopy time utilized. Radiation dose: 1.99 mGy. Please see the performing provider's procedural report for further detail. IMPRESSION: Fluoroscopic guidance provided for right total hip arthroplasty. Electronically Signed   By: Duanne Guess D.O.   On: 11/28/2022 10:20   DG C-Arm 1-60 Min-No Report  Result Date: 11/28/2022 Fluoroscopy was utilized by the requesting physician.  No radiographic interpretation.    Disposition: Discharge disposition: 01-Home or Self Care       Discharge Instructions     Call MD / Call 911   Complete by: As directed    If you experience chest pain or shortness of breath, CALL 911 and be transported to the hospital emergency room.  If you develope a fever above 101 F, pus (white drainage) or increased drainage or redness at the wound, or calf pain, call your surgeon's office.   Change dressing   Complete by: As directed    Maintain surgical dressing until follow up in the clinic. If the edges start  to pull up, may reinforce with tape. If the dressing is no longer working, may remove and cover with gauze and tape, but must keep the area dry and clean.  Call with any questions or concerns.   Constipation Prevention   Complete by: As directed    Drink plenty of fluids.  Prune juice may be helpful.  You may use a stool softener, such as Colace (over the counter) 100 mg twice a day.  Use MiraLax (over the counter) for constipation as needed.   Diet - low sodium heart healthy   Complete by: As directed    Increase activity slowly as tolerated   Complete by: As directed    Weight bearing as tolerated with assist device (walker, cane, etc) as directed, use it as long as suggested by your surgeon or therapist, typically at least 4-6 weeks.   Post-operative opioid taper instructions:  Complete by: As directed    POST-OPERATIVE OPIOID TAPER INSTRUCTIONS: It is important to wean off of your opioid medication as soon as possible. If you do not need pain medication after your surgery it is ok to stop day one. Opioids include: Codeine, Hydrocodone(Norco, Vicodin), Oxycodone(Percocet, oxycontin) and hydromorphone amongst others.  Long term and even short term use of opiods can cause: Increased pain response Dependence Constipation Depression Respiratory depression And more.  Withdrawal symptoms can include Flu like symptoms Nausea, vomiting And more Techniques to manage these symptoms Hydrate well Eat regular healthy meals Stay active Use relaxation techniques(deep breathing, meditating, yoga) Do Not substitute Alcohol to help with tapering If you have been on opioids for less than two weeks and do not have pain than it is ok to stop all together.  Plan to wean off of opioids This plan should start within one week post op of your joint replacement. Maintain the same interval or time between taking each dose and first decrease the dose.  Cut the total daily intake of opioids by one tablet  each day Next start to increase the time between doses. The last dose that should be eliminated is the evening dose.      TED hose   Complete by: As directed    Use stockings (TED hose) for 2 weeks on both leg(s).  You may remove them at night for sleeping.        Follow-up Information     Durene Romans, MD. Schedule an appointment as soon as possible for a visit in 2 week(s).   Specialty: Orthopedic Surgery Contact information: [address] [place] [address] [place] [address] [phone]                  Signed: Cassandria Anger 12/07/2022, 9:41 AM
